# Patient Record
Sex: Female | Born: 1950 | Race: Black or African American | Hispanic: No | Marital: Single | Smoking: Never smoker
Health system: Southern US, Community
[De-identification: ages and names within clinical notes are randomized; demographics above are authoritative.]

## PROBLEM LIST (undated history)

## (undated) DIAGNOSIS — C55 Malignant neoplasm of uterus, part unspecified: Secondary | ICD-10-CM

## (undated) DIAGNOSIS — I2699 Other pulmonary embolism without acute cor pulmonale: Secondary | ICD-10-CM

---

## 2007-03-29 DIAGNOSIS — D509 Iron deficiency anemia, unspecified: Secondary | ICD-10-CM

## 2007-03-29 HISTORY — DX: Iron deficiency anemia, unspecified: D50.9

## 2007-05-16 DIAGNOSIS — Z1211 Encounter for screening for malignant neoplasm of colon: Secondary | ICD-10-CM

## 2007-05-16 HISTORY — DX: Encounter for screening for malignant neoplasm of colon: Z12.11

## 2007-06-14 DIAGNOSIS — D250 Submucous leiomyoma of uterus: Secondary | ICD-10-CM

## 2007-06-14 HISTORY — DX: Submucous leiomyoma of uterus: D25.0

## 2020-01-16 DIAGNOSIS — C562 Malignant neoplasm of left ovary: Secondary | ICD-10-CM

## 2020-01-16 HISTORY — DX: Malignant neoplasm of left ovary: C56.2

## 2020-01-17 DIAGNOSIS — C786 Secondary malignant neoplasm of retroperitoneum and peritoneum: Secondary | ICD-10-CM

## 2020-01-17 HISTORY — DX: Secondary malignant neoplasm of retroperitoneum and peritoneum: C78.6

## 2020-03-05 ENCOUNTER — Encounter (HOSPITAL_BASED_OUTPATIENT_CLINIC_OR_DEPARTMENT_OTHER): Payer: Self-pay | Admitting: Emergency Medicine

## 2020-03-05 DIAGNOSIS — I10 Essential (primary) hypertension: Secondary | ICD-10-CM | POA: Diagnosis not present

## 2020-03-05 DIAGNOSIS — Z8541 Personal history of malignant neoplasm of cervix uteri: Secondary | ICD-10-CM | POA: Diagnosis not present

## 2020-03-05 DIAGNOSIS — Z20822 Contact with and (suspected) exposure to covid-19: Secondary | ICD-10-CM | POA: Insufficient documentation

## 2020-03-05 DIAGNOSIS — R9431 Abnormal electrocardiogram [ECG] [EKG]: Secondary | ICD-10-CM | POA: Diagnosis not present

## 2020-03-05 NOTE — ED Triage Notes (Signed)
Pt states her blood pressure has been increasing steadily   Pt was seen at urgent care and it was 216/116 and was instructed to come to the ER  Pt is from [place] and is here visiting

## 2020-03-06 ENCOUNTER — Encounter (HOSPITAL_BASED_OUTPATIENT_CLINIC_OR_DEPARTMENT_OTHER): Payer: Self-pay | Admitting: Emergency Medicine

## 2020-03-06 ENCOUNTER — Other Ambulatory Visit: Payer: Self-pay

## 2020-03-06 ENCOUNTER — Emergency Department (HOSPITAL_BASED_OUTPATIENT_CLINIC_OR_DEPARTMENT_OTHER)
Admission: EM | Admit: 2020-03-06 | Discharge: 2020-03-06 | Disposition: A | Discharge status: Home / Self Care | Payer: Medicare Other | Attending: Emergency Medicine | Admitting: Emergency Medicine

## 2020-03-06 ENCOUNTER — Emergency Department (HOSPITAL_BASED_OUTPATIENT_CLINIC_OR_DEPARTMENT_OTHER): Payer: Medicare Other

## 2020-03-06 DIAGNOSIS — R9431 Abnormal electrocardiogram [ECG] [EKG]: Secondary | ICD-10-CM

## 2020-03-06 DIAGNOSIS — I10 Essential (primary) hypertension: Secondary | ICD-10-CM | POA: Diagnosis not present

## 2020-03-06 HISTORY — DX: Malignant neoplasm of uterus, part unspecified: C55

## 2020-03-06 HISTORY — DX: Other pulmonary embolism without acute cor pulmonale: I26.99

## 2020-03-06 LAB — URINALYSIS, ROUTINE W REFLEX MICROSCOPIC
Bilirubin Urine: NEGATIVE
Glucose, UA: NEGATIVE mg/dL
Hgb urine dipstick: NEGATIVE
Ketones, ur: NEGATIVE mg/dL
Leukocytes,Ua: NEGATIVE
Nitrite: NEGATIVE
Protein, ur: NEGATIVE mg/dL
Specific Gravity, Urine: 1.005 (ref 1.005–1.030)
pH: 6 (ref 5.0–8.0)

## 2020-03-06 LAB — CBC WITH DIFFERENTIAL/PLATELET
Abs Immature Granulocytes: 0.05 10*3/uL (ref 0.00–0.07)
Basophils Absolute: 0 10*3/uL (ref 0.0–0.1)
Basophils Relative: 0 %
Eosinophils Absolute: 0 10*3/uL (ref 0.0–0.5)
Eosinophils Relative: 0 %
HCT: 35.1 % — ABNORMAL LOW (ref 36.0–46.0)
Hemoglobin: 11.6 g/dL — ABNORMAL LOW (ref 12.0–15.0)
Immature Granulocytes: 1 %
Lymphocytes Relative: 14 %
Lymphs Abs: 0.6 10*3/uL — ABNORMAL LOW (ref 0.7–4.0)
MCH: 27.1 pg (ref 26.0–34.0)
MCHC: 33 g/dL (ref 30.0–36.0)
MCV: 82 fL (ref 80.0–100.0)
Monocytes Absolute: 0.1 10*3/uL (ref 0.1–1.0)
Monocytes Relative: 2 %
Neutro Abs: 3.5 10*3/uL (ref 1.7–7.7)
Neutrophils Relative %: 83 %
Platelets: 136 10*3/uL — ABNORMAL LOW (ref 150–400)
RBC: 4.28 MIL/uL (ref 3.87–5.11)
RDW: 15.6 % — ABNORMAL HIGH (ref 11.5–15.5)
WBC: 4.2 10*3/uL (ref 4.0–10.5)
nRBC: 0 % (ref 0.0–0.2)

## 2020-03-06 LAB — BASIC METABOLIC PANEL
Anion gap: 12 (ref 5–15)
BUN: 11 mg/dL (ref 8–23)
CO2: 23 mmol/L (ref 22–32)
Calcium: 9.8 mg/dL (ref 8.9–10.3)
Chloride: 101 mmol/L (ref 98–111)
Creatinine, Ser: 0.81 mg/dL (ref 0.44–1.00)
GFR, Estimated: >60 mL/min (ref >60)
Glucose, Bld: 195 mg/dL — ABNORMAL HIGH (ref 70–99)
Potassium: 3.5 mmol/L (ref 3.5–5.1)
Sodium: 136 mmol/L (ref 135–145)

## 2020-03-06 LAB — RESP PANEL BY RT-PCR (FLU A&B, COVID) ARPGX2
Influenza A by PCR: NEGATIVE
Influenza B by PCR: NEGATIVE
SARS Coronavirus 2 by RT PCR: NEGATIVE

## 2020-03-06 LAB — TROPONIN I (HIGH SENSITIVITY): Troponin I (High Sensitivity): 7 ng/L (ref <18)

## 2020-03-06 MED ORDER — AMLODIPINE BESYLATE 5 MG PO TABS: 5.0000 mg | ORAL_TABLET | Freq: Every day | ORAL | 0 refills | Status: DC

## 2020-03-06 MED ORDER — AMLODIPINE BESYLATE 5 MG PO TABS
5.0000 mg | ORAL_TABLET | Freq: Once | ORAL | Status: AC
  Administered 2020-03-06: 5 mg via ORAL
  Filled 2020-03-06: qty 1

## 2020-03-06 MED ORDER — HEPARIN SOD (PORK) LOCK FLUSH 100 UNIT/ML IV SOLN
500.0000 [IU] | Freq: Once | INTRAVENOUS | Status: AC
  Administered 2020-03-06: 500 [IU]
  Filled 2020-03-06: qty 5

## 2020-03-06 NOTE — ED Notes (Signed)
Second trop discontinued per EDP

## 2020-03-06 NOTE — ED Provider Notes (Signed)
[facility] Provider Note   CSN: [id number] Arrival date & time: 03/05/20  9371     History Chief Complaint  Patient presents with  . Hypertension    [facility] is a 70 y.o. female.  The history is provided by the patient.  Hypertension This is a chronic problem. The current episode started more than 1 week ago. The problem occurs constantly. The problem has been gradually worsening. Pertinent negatives include no chest pain, no abdominal pain, no headaches and no shortness of breath. Nothing aggravates the symptoms. Nothing relieves the symptoms. She has tried nothing for the symptoms. The treatment provided no relief.  Patient with cancer with HTN since November that no one is treating.  Patient is asymptomatic but sent in for same.      Past Medical History:  Diagnosis Date  . Pulmonary emboli ([place])   . Uterine cancer ([place])     There are no problems to display for this patient.   History reviewed. No pertinent surgical history.   OB History   No obstetric history on file.     Family History  Problem Relation Age of Onset  . Congenital heart disease Mother   . Heart failure Father     Social History   Tobacco Use  . Smoking status: Never Smoker  . Smokeless tobacco: Never Used  Vaping Use  . Vaping Use: Never used  Substance Use Topics  . Alcohol use: Yes    Comment: seldom  . Drug use: Never    Home Medications Prior to Admission medications   Not on File    Allergies    Patient has no known allergies.  Review of Systems   Review of Systems  Constitutional: Negative for fever.  HENT: Negative for congestion.   Eyes: Negative for visual disturbance.  Respiratory: Negative for chest tightness and shortness of breath.   Cardiovascular: Negative for chest pain.  Gastrointestinal: Negative for abdominal pain and vomiting.  Genitourinary: Negative for difficulty urinating.  Musculoskeletal: Negative for back  pain and neck pain.  Skin: Negative for wound.  Neurological: Negative for dizziness, seizures, facial asymmetry, speech difficulty, weakness, numbness and headaches.  Psychiatric/Behavioral: Negative for agitation.  All other systems reviewed and are negative.   Physical Exam Updated Vital Signs BP (!) 179/82   Pulse 62   Temp 97.6 F (36.4 C) (Oral)   Resp 16   Ht 5\' 5"  (1.651 m)   Wt 88 kg   SpO2 95%   BMI 32.28 kg/m   Physical Exam Vitals and nursing note reviewed.  Constitutional:      General: She is not in acute distress.    Appearance: Normal appearance.  HENT:     Head: Normocephalic and atraumatic.     Nose: Nose normal.  Eyes:     Extraocular Movements: Extraocular movements intact.     Conjunctiva/sclera: Conjunctivae normal.     Pupils: Pupils are equal, round, and reactive to light.  Cardiovascular:     Rate and Rhythm: Normal rate and regular rhythm.     Pulses: Normal pulses.     Heart sounds: Normal heart sounds.  Pulmonary:     Effort: Pulmonary effort is normal.     Breath sounds: Normal breath sounds.  Abdominal:     General: Abdomen is flat. Bowel sounds are normal.     Palpations: Abdomen is soft.     Tenderness: There is no abdominal tenderness. There is no guarding.  Musculoskeletal:  General: Normal range of motion.     Cervical back: Normal range of motion and neck supple.  Skin:    General: Skin is warm and dry.     Capillary Refill: Capillary refill takes less than 2 seconds.  Neurological:     General: No focal deficit present.     Mental Status: She is alert and oriented to person, place, and time.     Deep Tendon Reflexes: Reflexes normal.  Psychiatric:        Mood and Affect: Mood normal.        Behavior: Behavior normal.     ED Results / Procedures / Treatments   Labs (all labs ordered are listed, but only abnormal results are displayed) Results for orders placed or performed during the hospital encounter of 03/06/20   Resp Panel by RT-PCR (Flu A&B, Covid) Nasopharyngeal Swab   Specimen: Nasopharyngeal Swab; Nasopharyngeal(NP) swabs in vial transport medium  Result Value Ref Range   SARS Coronavirus 2 by RT PCR NEGATIVE NEGATIVE   Influenza A by PCR NEGATIVE NEGATIVE   Influenza B by PCR NEGATIVE NEGATIVE  CBC with Differential/Platelet  Result Value Ref Range   WBC 4.2 4.0 - 10.5 K/uL   RBC 4.28 3.87 - 5.11 MIL/uL   Hemoglobin 11.6 (L) 12.0 - 15.0 g/dL   HCT 35.1 (L) 36.0 - 46.0 %   MCV 82.0 80.0 - 100.0 fL   MCH 27.1 26.0 - 34.0 pg   MCHC 33.0 30.0 - 36.0 g/dL   RDW 15.6 (H) 11.5 - 15.5 %   Platelets 136 (L) 150 - 400 K/uL   nRBC 0.0 0.0 - 0.2 %   Neutrophils Relative % 83 %   Neutro Abs 3.5 1.7 - 7.7 K/uL   Lymphocytes Relative 14 %   Lymphs Abs 0.6 (L) 0.7 - 4.0 K/uL   Monocytes Relative 2 %   Monocytes Absolute 0.1 0.1 - 1.0 K/uL   Eosinophils Relative 0 %   Eosinophils Absolute 0.0 0.0 - 0.5 K/uL   Basophils Relative 0 %   Basophils Absolute 0.0 0.0 - 0.1 K/uL   Immature Granulocytes 1 %   Abs Immature Granulocytes 0.05 0.00 - 0.07 K/uL  Basic metabolic panel  Result Value Ref Range   Sodium 136 135 - 145 mmol/L   Potassium 3.5 3.5 - 5.1 mmol/L   Chloride 101 98 - 111 mmol/L   CO2 23 22 - 32 mmol/L   Glucose, Bld 195 (H) 70 - 99 mg/dL   BUN 11 8 - 23 mg/dL   Creatinine, Ser 0.81 0.44 - 1.00 mg/dL   Calcium 9.8 8.9 - 10.3 mg/dL   GFR, Estimated >60 >60 mL/min   Anion gap 12 5 - 15  Troponin I (High Sensitivity)  Result Value Ref Range   Troponin I (High Sensitivity) 7 <18 ng/L   DG Chest Portable 1 View  Result Date: 03/06/2020 CLINICAL DATA:  Pain.  Elevated blood pressure. EXAM: PORTABLE CHEST 1 VIEW COMPARISON:  None. FINDINGS: There is a well-positioned right-sided Port-A-Cath. There is no pneumothorax or large pleural effusion. The heart size is unremarkable. Aortic calcifications are noted. There is an old healed left-sided rib fracture. IMPRESSION: 1. No acute  cardiopulmonary process. 2. Well-positioned right-sided Port-A-Cath. Electronically Signed   By: Constance Holster M.D.   On: 03/06/2020 02:08    EKG  EKG Interpretation  Date/Time:  Wednesday March 06 2020 01:46:13 EST Ventricular Rate:  72 PR Interval:    QRS Duration: 111 QT  Interval:  613 QTC Calculation: 672 R Axis:   -32 Text Interpretation: Sinus rhythm Left axis deviation Prolonged QT interval Confirmed by Dory Horn) on 03/06/2020 4:13:43 AM       Radiology DG Chest Portable 1 View  Result Date: 03/06/2020 CLINICAL DATA:  Pain.  Elevated blood pressure. EXAM: PORTABLE CHEST 1 VIEW COMPARISON:  None. FINDINGS: There is a well-positioned right-sided Port-A-Cath. There is no pneumothorax or large pleural effusion. The heart size is unremarkable. Aortic calcifications are noted. There is an old healed left-sided rib fracture. IMPRESSION: 1. No acute cardiopulmonary process. 2. Well-positioned right-sided Port-A-Cath. Electronically Signed   By: Constance Holster M.D.   On: 03/06/2020 02:08    Procedures Procedures (including critical care time)  Medications Ordered in ED Medications  amLODipine (NORVASC) tablet 5 mg (5 mg Oral Given 03/06/20 0242)    ED Course  I have reviewed the triage vital signs and the nursing notes.  Pertinent labs & imaging results that were available during my care of the patient were reviewed by me and considered in my medical decision making (see chart for details).   BP already improved without intervential.  Low dose norvasc started in the ED. aptient will need to follow up with cardiology regarding BP and prolonged QT interval on EKG.  No QT prolonging medications.    [facility] was evaluated in Emergency Department on 03/06/2020 for the symptoms described in the history of present illness. She was evaluated in the context of the global COVID-19 pandemic, which necessitated consideration that the patient might be at risk for  infection with the SARS-CoV-2 virus that causes COVID-19. Institutional protocols and algorithms that pertain to the evaluation of patients at risk for COVID-19 are in a state of rapid change based on information released by regulatory bodies including the CDC and federal and state organizations. These policies and algorithms were followed during the patient's care in the ED.  Final Clinical Impression(s) / ED Diagnoses Final diagnoses:  Primary hypertension  Prolonged Q-T interval on ECG   Return for intractable cough, coughing up blood,fevers >100.4 unrelieved by medication, shortness of breath, intractable vomiting, chest pain, shortness of breath, weakness,numbness, changes in speech, facial asymmetry,abdominal pain, passing out,Inability to tolerate liquids or food, cough, altered mental status or any concerns. No signs of systemic illness or infection. The patient is nontoxic-appearing on exam and vital signs are within normal limits.   I have reviewed the triage vital signs and the nursing notes. Pertinent labs &imaging results that were available during my care of the patient were reviewed by me and considered in my medical decision making (see chart for details).After history, exam, and medical workup I feel the patient has beenappropriately medically screened and is safe for discharge home. Pertinent diagnoses were discussed with the patient. Patient was given return precautions.      Janaisha Tolsma, MD 03/06/20 [phone]

## 2020-03-12 ENCOUNTER — Other Ambulatory Visit: Payer: Self-pay

## 2020-03-12 DIAGNOSIS — C55 Malignant neoplasm of uterus, part unspecified: Secondary | ICD-10-CM | POA: Insufficient documentation

## 2020-03-12 DIAGNOSIS — C541 Malignant neoplasm of endometrium: Secondary | ICD-10-CM

## 2020-03-12 DIAGNOSIS — I2699 Other pulmonary embolism without acute cor pulmonale: Secondary | ICD-10-CM | POA: Insufficient documentation

## 2020-03-12 DIAGNOSIS — I10 Essential (primary) hypertension: Secondary | ICD-10-CM | POA: Insufficient documentation

## 2020-03-12 HISTORY — DX: Malignant neoplasm of endometrium: C54.1

## 2020-03-14 ENCOUNTER — Other Ambulatory Visit: Payer: Self-pay

## 2020-03-14 ENCOUNTER — Encounter: Payer: Self-pay | Admitting: Cardiology

## 2020-03-14 ENCOUNTER — Ambulatory Visit (INDEPENDENT_AMBULATORY_CARE_PROVIDER_SITE_OTHER): Payer: Medicare Other | Admitting: Cardiology

## 2020-03-14 VITALS — BP 164/100 | HR 100 | Ht 65.0 in | Wt 189.0 lb

## 2020-03-14 DIAGNOSIS — R600 Localized edema: Secondary | ICD-10-CM | POA: Diagnosis not present

## 2020-03-14 DIAGNOSIS — R9431 Abnormal electrocardiogram [ECG] [EKG]: Secondary | ICD-10-CM

## 2020-03-14 DIAGNOSIS — I10 Essential (primary) hypertension: Secondary | ICD-10-CM | POA: Diagnosis not present

## 2020-03-14 MED ORDER — METOPROLOL TARTRATE 100 MG PO TABS: 100.0000 mg | ORAL_TABLET | Freq: Once | ORAL | 0 refills | Status: DC

## 2020-03-14 MED ORDER — HYDROCHLOROTHIAZIDE 25 MG PO TABS: 25.0000 mg | ORAL_TABLET | Freq: Every day | ORAL | 1 refills | Status: AC

## 2020-03-14 NOTE — Patient Instructions (Signed)
Medication Instructions:  Your physician has recommended you make the following change in your medication:   START: Hydrochlorothiazide 25 mg daily   *If you need a refill on your cardiac medications before your next appointment, please call your pharmacy*   Lab Work: Your physician recommends that you return for lab work today: bmp, mg   Your physician recommends that you return for lab work 3-7 days before ct: BMP If you have labs (blood work) drawn today and your tests are completely normal, you will receive your results only by: Marland Kitchen MyChart Message (if you have MyChart) OR . A paper copy in the mail If you have any lab test that is abnormal or we need to change your treatment, we will call you to review the results.   Testing/Procedures: Your cardiac CT will be scheduled at one of the below locations:   [facility] [address] [place], [place] [address] [phone]  [facility] [address] [place], [place] [address] [phone]  If scheduled at [facility], please arrive at the [facility] main entrance of [facility] 30 minutes prior to test start time. Proceed to the [facility] Radiology Department (first floor) to check-in and test prep.  If scheduled at [facility], please arrive 15 mins early for check-in and test prep.  Please follow these instructions carefully (unless otherwise directed):   On the Night Before the Test: . Be sure to Drink plenty of water. . Do not consume any caffeinated/decaffeinated beverages or chocolate 12 hours prior to your test. . Do not take any antihistamines 12 hours prior to your test.   On the Day of the Test: . Drink plenty of water. Do not drink any water within one hour of the test. . Do not eat any food 4 hours prior to the test. . You may take your regular medications prior to the test.  . Take metoprolol  (Lopressor) two hours prior to test. . HOLD Hydrochlorothiazide morning of the test. . FEMALES- please wear underwire-free bra if available       After the Test: . Drink plenty of water. . After receiving IV contrast, you may experience a mild flushed feeling. This is normal. . On occasion, you may experience a mild rash up to 24 hours after the test. This is not dangerous. If this occurs, you can take Benadryl 25 mg and increase your fluid intake. . If you experience trouble breathing, this can be serious. If it is severe call 911 IMMEDIATELY. If it is mild, please call our office. . If you take any of these medications: Glipizide/Metformin, Avandament, Glucavance, please do not take 48 hours after completing test unless otherwise instructed.   Once we have confirmed authorization from your insurance company, we will call you to set up a date and time for your test. Based on how quickly your insurance processes prior authorizations requests, please allow up to 4 weeks to be contacted for scheduling your Cardiac CT appointment. Be advised that routine Cardiac CT appointments could be scheduled as many as 8 weeks after your provider has ordered it.  For non-scheduling related questions, please contact the cardiac imaging nurse navigator should you have any questions/concerns: Marchia Bond, Cardiac Imaging Nurse Navigator Burley Saver, Interim Cardiac Imaging Nurse [place] [facility] Direct Office Dial: [phone]   For scheduling needs, including cancellations and rescheduling, please call [place], [phone].  Your physician has requested that you have an echocardiogram. Echocardiography is a painless test that uses sound waves to create images of your heart. It provides your doctor with information about the size and shape of your heart and how well your heart's chambers and valves are working. This procedure takes approximately one hour. There are no  restrictions for this procedure.    Follow-Up: At [facility], you and your health needs are our priority.  As part of our continuing mission to provide you with exceptional heart care, we have created designated Provider Care Teams.  These Care Teams include your primary Cardiologist (physician) and Advanced Practice Providers (APPs -  Physician Assistants and Nurse Practitioners) who all work together to provide you with the care you need, when you need it.  We recommend signing up for the patient portal called "MyChart".  Sign up information is provided on this After Visit Summary.  MyChart is used to connect with patients for Virtual Visits (Telemedicine).  Patients are able to view lab/test results, encounter notes, upcoming appointments, etc.  Non-urgent messages can be sent to your provider as well.   To learn more about what you can do with MyChart, go to [email or web address].    Your next appointment:   1 month(s)  The format for your next appointment:   In Person  Provider:   Berniece Salines, DO   Other Instructions   Echocardiogram An echocardiogram is a test that uses sound waves (ultrasound) to produce images of the heart. Images from an echocardiogram can provide important information about:  Heart size and shape.  The size and thickness and movement of your heart's walls.  Heart muscle function and strength.  Heart valve function or if you have stenosis. Stenosis is when the heart valves are too narrow.  If blood is flowing backward through the heart valves (regurgitation).  A tumor or infectious growth around the heart valves.  Areas of heart muscle that are not working well because of poor blood flow or injury from a heart attack.  Aneurysm detection. An aneurysm is a weak or damaged part of an artery wall. The wall bulges out from the normal force of blood pumping through the body. Tell a health care provider about:  Any allergies you have.  All  medicines you are taking, including vitamins, herbs, eye drops, creams, and over-the-counter medicines.  Any blood disorders you have.  Any surgeries you have had.  Any medical conditions you have.  Whether you are pregnant or may be pregnant. What are the risks? Generally, this is a safe test. However, problems may occur, including an allergic reaction to dye (contrast) that may be used during the test. What happens before the test? No specific preparation is needed. You may eat and drink normally. What happens during the test?  You will take off your clothes from the waist up and put on a hospital gown.  Electrodes or electrocardiogram (ECG)patches may be placed on your chest. The electrodes or patches are then connected to a device that monitors your heart rate and rhythm.  You will lie down on a table for an ultrasound exam. A gel will be applied to your chest to help sound waves pass through your skin.  A handheld device, called a transducer, will be pressed against your chest and moved over your heart. The transducer produces sound waves that travel to your heart and bounce back (or "echo" back) to the transducer. These sound waves will be captured in real-time  and changed into images of your heart that can be viewed on a video monitor. The images will be recorded on a computer and reviewed by your health care provider.  You may be asked to change positions or hold your breath for a short time. This makes it easier to get different views or better views of your heart.  In some cases, you may receive contrast through an IV in one of your veins. This can improve the quality of the pictures from your heart. The procedure may vary among health care providers and hospitals.   What can I expect after the test? You may return to your normal, everyday life, including diet, activities, and medicines, unless your health care provider tells you not to do that. Follow these instructions at  home:  It is up to you to get the results of your test. Ask your health care provider, or the department that is doing the test, when your results will be ready.  Keep all follow-up visits. This is important. Summary  An echocardiogram is a test that uses sound waves (ultrasound) to produce images of the heart.  Images from an echocardiogram can provide important information about the size and shape of your heart, heart muscle function, heart valve function, and other possible heart problems.  You do not need to do anything to prepare before this test. You may eat and drink normally.  After the echocardiogram is completed, you may return to your normal, everyday life, unless your health care provider tells you not to do that. This information is not intended to replace advice given to you by your health care provider. Make sure you discuss any questions you have with your health care provider. Document Revised: 10/03/2019 Document Reviewed: 10/03/2019 Elsevier Patient Education  2021 [place].   Cardiac CT Angiogram A cardiac CT angiogram is a procedure to look at the heart and the area around the heart. It may be done to help find the cause of chest pains or other symptoms of heart disease. During this procedure, a substance called contrast dye is injected into the blood vessels in the area to be checked. A large X-ray machine, called a CT scanner, then takes detailed pictures of the heart and the surrounding area. The procedure is also sometimes called a coronary CT angiogram, coronary artery scanning, or CTA. A cardiac CT angiogram allows the health care provider to see how well blood is flowing to and from the heart. The health care provider will be able to see if there are any problems, such as:  Blockage or narrowing of the coronary arteries in the heart.  Fluid around the heart.  Signs of weakness or disease in the muscles, valves, and tissues of the heart. Tell a health care  provider about:  Any allergies you have. This is especially important if you have had a previous allergic reaction to contrast dye.  All medicines you are taking, including vitamins, herbs, eye drops, creams, and over-the-counter medicines.  Any blood disorders you have.  Any surgeries you have had.  Any medical conditions you have.  Whether you are pregnant or may be pregnant.  Any anxiety disorders, chronic pain, or other conditions you have that may increase your stress or prevent you from lying still. What are the risks? Generally, this is a safe procedure. However, problems may occur, including:  Bleeding.  Infection.  Allergic reactions to medicines or dyes.  Damage to other structures or organs.  Kidney damage from the  contrast dye that is used.  Increased risk of cancer from radiation exposure. This risk is low. Talk with your health care provider about: ? The risks and benefits of testing. ? How you can receive the lowest dose of radiation. What happens before the procedure?  Wear comfortable clothing and remove any jewelry, glasses, dentures, and hearing aids.  Follow instructions from your health care provider about eating and drinking. This may include: ? For 12 hours before the procedure -- avoid caffeine. This includes tea, coffee, soda, energy drinks, and diet pills. Drink plenty of water or other fluids that do not have caffeine in them. Being well hydrated can prevent complications. ? For 4-6 hours before the procedure -- stop eating and drinking. The contrast dye can cause nausea, but this is less likely if your stomach is empty.  Ask your health care provider about changing or stopping your regular medicines. This is especially important if you are taking diabetes medicines, blood thinners, or medicines to treat problems with erections (erectile dysfunction). What happens during the procedure?  Hair on your chest may need to be removed so that small sticky  patches called electrodes can be placed on your chest. These will transmit information that helps to monitor your heart during the procedure.  An IV will be inserted into one of your veins.  You might be given a medicine to control your heart rate during the procedure. This will help to ensure that good images are obtained.  You will be asked to lie on an exam table. This table will slide in and out of the CT machine during the procedure.  Contrast dye will be injected into the IV. You might feel warm, or you may get a metallic taste in your mouth.  You will be given a medicine called nitroglycerin. This will relax or dilate the arteries in your heart.  The table that you are lying on will move into the CT machine tunnel for the scan.  The person running the machine will give you instructions while the scans are being done. You may be asked to: ? Keep your arms above your head. ? Hold your breath. ? Stay very still, even if the table is moving.  When the scanning is complete, you will be moved out of the machine.  The IV will be removed. The procedure may vary among health care providers and hospitals.   What can I expect after the procedure? After your procedure, it is common to have:  A metallic taste in your mouth from the contrast dye.  A feeling of warmth.  A headache from the nitroglycerin. Follow these instructions at home:  Take over-the-counter and prescription medicines only as told by your health care provider.  If you are told, drink enough fluid to keep your urine pale yellow. This will help to flush the contrast dye out of your body.  Most people can return to their normal activities right after the procedure. Ask your health care provider what activities are safe for you.  It is up to you to get the results of your procedure. Ask your health care provider, or the department that is doing the procedure, when your results will be ready.  Keep all follow-up visits  as told by your health care provider. This is important. Contact a health care provider if:  You have any symptoms of allergy to the contrast dye. These include: ? Shortness of breath. ? Rash or hives. ? A racing heartbeat. Summary  A cardiac CT angiogram is a procedure to look at the heart and the area around the heart. It may be done to help find the cause of chest pains or other symptoms of heart disease.  During this procedure, a large X-ray machine, called a CT scanner, takes detailed pictures of the heart and the surrounding area after a contrast dye has been injected into blood vessels in the area.  Ask your health care provider about changing or stopping your regular medicines before the procedure. This is especially important if you are taking diabetes medicines, blood thinners, or medicines to treat erectile dysfunction.  If you are told, drink enough fluid to keep your urine pale yellow. This will help to flush the contrast dye out of your body. This information is not intended to replace advice given to you by your health care provider. Make sure you discuss any questions you have with your health care provider. Document Revised: 10/05/2018 Document Reviewed: 10/05/2018 Elsevier Patient Education  2021 [place].  Hydrochlorothiazide Capsules or Tablets What is this medicine? HYDROCHLOROTHIAZIDE (hye droe klor oh THYE a zide) is a diuretic. It helps you make more urine and to lose salt and excess water from your body. It treats swelling from heart, kidney, or liver disease. It also treats high blood pressure. This medicine may be used for other purposes; ask your health care provider or pharmacist if you have questions. COMMON BRAND NAME(S): Esidrix, Ezide, HydroDIURIL, Microzide, Oretic, Zide What should I tell my health care provider before I take this medicine? They need to know if you have any of these conditions:  diabetes  gout  kidney disease  liver  disease  lupus  pancreatitis  an unusual or allergic reaction to hydrochlorothiazide, sulfa drugs, other medicines, foods, dyes, or preservatives  pregnant or trying to get pregnant  breast-feeding How should I use this medicine? Take this medicine by mouth. Take it as directed on the prescription label at the same time every day. You can take it with or without food. If it upsets your stomach, take it with food. Keep taking it unless your health care provider tells you to stop. Talk to your health care provider about the use of this medicine in children. While it may be prescribed for children as young as newborns for selected conditions, precautions do apply. Overdosage: If you think you have taken too much of this medicine contact a poison control center or emergency room at once. NOTE: This medicine is only for you. Do not share this medicine with others. What if I miss a dose? If you miss a dose, take it as soon as you can. If it is almost time for your next dose, take only that dose. Do not take double or extra doses. What may interact with this medicine?  cholestyramine  colestipol  digoxin  dofetilide  lithium  medicines for blood pressure  medicines for diabetes  medicines that relax muscles for surgery  other diuretics  steroid medicines like prednisone or cortisone This list may not describe all possible interactions. Give your health care provider a list of all the medicines, herbs, non-prescription drugs, or dietary supplements you use. Also tell them if you smoke, drink alcohol, or use illegal drugs. Some items may interact with your medicine. What should I watch for while using this medicine? Visit your health care provider for regular check ups. Check your blood pressure as directed. Ask your health care provider what your blood pressure should be. Also, find  out when you should contact him or her. Do not treat yourself for coughs, colds, or pain while you  are using this medicine without asking your health care provider for advice. Some medicines may increase your blood pressure. You may get drowsy or dizzy. Do not drive, use machinery, or do anything that needs mental alertness until you know how this medicine affects you. Do not stand or sit up quickly, especially if you are an older patient. This reduces the risk of dizzy or fainting spells. Alcohol can make you more drowsy and dizzy. Avoid alcoholic drinks. Talk to your health care professional about your risk of skin cancer. You may be more at risk for skin cancer if you take this medicine. This medicine can make you more sensitive to the sun. Keep out of the sun. If you cannot avoid being in the sun, wear protective clothing and use sunscreen. Do not use sun lamps or tanning beds/booths. You may need to be on a special diet while taking this medicine. Ask your health care provider. Also, find out how many glasses of fluids you need to drink each day. Check with your health care provider if you get an attack of severe diarrhea, nausea and vomiting, or if you sweat a lot. The loss of too much body fluid can make it dangerous for you to take this medicine. This medicine may increase blood sugar. Ask your healthcare provider if changes in diet or medicines are needed if you have diabetes. What side effects may I notice from receiving this medicine? Side effects that you should report to your doctor or health care professional as soon as possible:  allergic reactions (skin rash, itching or hives; swelling of the face, lips, or tongue)  gout (severe pain, redness, or swelling in joints like the big toe)  high blood sugar (increased hunger, thirst or urination; unusually weak or tired; blurry vision)  kidney injury (trouble passing urine or change in the amount of urine)  low blood pressure (dizziness; feeling faint or lightheaded, falls; unusually weak or tired)  low potassium levels (trouble  breathing; chest pain; dizziness; fast, irregular heartbeat; feeling faint or lightheaded, falls; muscle cramps or pain)  sudden change in vision or eye pain Side effects that usually do not require medical attention (report to your doctor or health care professional if they continue or are bothersome):  change in sex drive or performance  dry mouth  headache  stomach upset This list may not describe all possible side effects. Call your doctor for medical advice about side effects. You may report side effects to FDA at 1-800-FDA-1088. Where should I keep my medicine? Keep out of the reach of children and pets. Store at room temperature between 20 and 25 degrees C (68 and 77 degrees F). Protect from light and moisture. Keep the container tightly closed. Do not freeze. Get rid of any unused medicine after the expiration date. To get rid of medicines that are no longer needed or have expired:  Take the medicine to a medicine take-back program. Check with your pharmacy or law enforcement to find a location.  If you cannot return the medicine, check the label or package insert to see if the medicine should be thrown out in the garbage or flushed down the toilet. If you are not sure, ask your health care provider. If it is safe to put in the trash, empty the medicine out of the container. Mix the medicine with cat litter, dirt, coffee grounds, or  other unwanted substance. Seal the mixture in a bag or container. Put it in the trash. NOTE: This sheet is a summary. It may not cover all possible information. If you have questions about this medicine, talk to your doctor, pharmacist, or health care provider.  2021 Elsevier/Gold Standard (2019-12-20 17:16:00)

## 2020-03-14 NOTE — Progress Notes (Signed)
Cardiology Office Note:    Date:  03/14/2020   ID:  Crystal Wolf, DOB Jun 04, 1950, MRN [id number]  PCP:  Patient, No Pcp Per  Cardiologist:  Crystal Salines, DO  Electrophysiologist:  None   Referring MD: No ref. provider found   " I was recently diagnosed with hypertension"  History of Present Illness:    Crystal Wolf is a 70 y.o. female with a hx pulmonary embolism on Eliquis, of high-grade ovarian carcinoma holes been receiving chemotherapy with [facility] she has completed 3 cycles of Taxol/carboplatin.  After her third cycle of chemotherapy the patient became significantly hypertensive she was recommended to be seen in emergency department unfortunately she tells me the emergency department was that and she had to end up going to 2 different urgent care and finally was seen at the last urgent care at which time she was started on amlodipine 5 mg daily.  Prior to this she tells me she has not had any hypertension issues.  He has not pretty much had any cardiovascular problems that she was aware of.   Of note the patient was diagnosed with high-grade ovarian carcinoma at [facility] and she follow-up with Dr. Sabra Wolf who she plans to go back to in March for upcoming surgery.  She had come to [facility] because her daughter lives here and it was easier for her to get her chemotherapy through [facility],.  She denies any chest pain, shortness of breath lightheadedness or dizziness.  She has been taking her amlodipine 5 mg daily.  She recently bought a blood pressure cuff.  She tells me she normally does not eat increased salt.  Past Medical History:  Diagnosis Date  . Iron deficiency anemia 03/29/2007  . 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 Pulmonary emboli ([place])   . Screening for colon cancer 05/16/2007   Formatting  of this note might be different from the original. colonoscopy 8/09 normal.  Next colorectal cancer screening due Aug 2019  . Submucous uterine fibroid 06/14/2007  . Uterine cancer ([place])     History reviewed. No pertinent surgical history.  Current Medications: Current Meds  Medication Sig  . amLODipine (NORVASC) 5 MG tablet Take 1 tablet (5 mg total) by mouth daily.  Marland Kitchen ELIQUIS 5 MG TABS tablet Take 5 mg by mouth 2 (two) times daily.  . hydrochlorothiazide (HYDRODIURIL) 25 MG tablet Take 1 tablet (25 mg total) by mouth daily.  . metoprolol tartrate (LOPRESSOR) 100 MG tablet Take 1 tablet (100 mg total) by mouth once for 1 dose. 2 hour before ct  . [DISCONTINUED] dexamethasone (DECADRON) 4 MG tablet Take by mouth.     Allergies:   Codeine   Social History   Socioeconomic History  . Marital status: Single    Spouse name: Not on file  . Number of children: Not on file  . Years of education: Not on file  . Highest education level: Not on file  Occupational History  . Not on file  Tobacco Use  . Smoking status: Never Smoker  . Smokeless tobacco: Never Used  Vaping Use  . Vaping Use: Never used  Substance and Sexual Activity  . Alcohol use: Yes    Comment: seldom  . Drug use: Never  . Sexual activity: Not on file  Other Topics Concern  . Not on file  Social History Narrative  . Not on file   Social Determinants of Health   Financial Resource Strain: Not on file  Food Insecurity: Not on file  Transportation Needs: Not on file  Physical Activity: Not on file  Stress: Not on file  Social Connections: Not on file     Family History: The patient's family history includes Congenital heart disease in her mother; Heart failure in her father.  ROS:   Review of Systems  Constitution: Negative for decreased appetite, fever and weight gain.  HENT: Negative for congestion, ear discharge, hoarse voice and sore throat.   Eyes: Negative for discharge, redness, vision loss in  right eye and visual halos.  Cardiovascular: Negative for chest pain, dyspnea on exertion, leg swelling, orthopnea and palpitations.  Respiratory: Negative for cough, hemoptysis, shortness of breath and snoring.   Endocrine: Negative for heat intolerance and polyphagia.  Hematologic/Lymphatic: Negative for bleeding problem. Does not bruise/bleed easily.  Skin: Negative for flushing, nail changes, rash and suspicious lesions.  Musculoskeletal: Negative for arthritis, joint pain, muscle cramps, myalgias, neck pain and stiffness.  Gastrointestinal: Negative for abdominal pain, bowel incontinence, diarrhea and excessive appetite.  Genitourinary: Negative for decreased libido, genital sores and incomplete emptying.  Neurological: Negative for brief paralysis, focal weakness, headaches and loss of balance.  Psychiatric/Behavioral: Negative for altered mental status, depression and suicidal ideas.  Allergic/Immunologic: Negative for HIV exposure and persistent infections.    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  There is also LVH by aVL criteria.  Nonspecific interventricular conduction defect.  And compared to her EKG which was done on March 07, 2019 at which time she did have elevated QTC 672 but today QTC is normal  Recent Labs: 03/06/2020: BUN 11; Creatinine, Ser 0.81; Hemoglobin 11.6; Platelets 136; Potassium 3.5; Sodium 136  Recent Lipid Panel No results found for: CHOL, TRIG, HDL, CHOLHDL, VLDL, LDLCALC, LDLDIRECT  Physical Exam:    VS:  BP (!) 164/100 (BP Location: Right Arm)   Pulse 100   Ht _0  (1.651 m)   Wt 189 lb (85.7 kg)   SpO2 97%   BMI 31.45 kg/m     Wt Readings from Last 3 Encounters:  03/14/20 189 lb (85.7 kg)  03/05/20 194 lb (88 kg)     GEN: Well nourished, well  developed in no acute distress HEENT: Normal NECK: + JVD; No carotid bruits LYMPHATICS: No lymphadenopathy CARDIAC: S1S2 noted,RRR, no murmurs, rubs, gallops RESPIRATORY:  Clear to auscultation without rales, wheezing or rhonchi  ABDOMEN: Soft, non-tender, non-distended, +bowel sounds, no guarding. EXTREMITIES: Bilateral +1 extremity  edema, No cyanosis, no clubbing MUSCULOSKELETAL:  No deformity  SKIN: Warm and dry NEUROLOGIC:  Alert and oriented x 3, non-focal PSYCHIATRIC:  Normal affect, good insight  ASSESSMENT:    1. Hypertension, unspecified type   2. Abnormal electrocardiogram   3. Bilateral leg edema   4. Prolonged QT interval    PLAN:     1.  She is hypertensive in the office today manually done by me.  What I like to do is keep her on amlodipine and add hydrochlorothiazide 5 mg daily.  Educated the patient what it means to have hypertension also that her goal is less than 130/80 mmHg.  She will continue to take her blood pressure daily she will call my office  with this information in 2 weeks at which time if adjustments need to be done this will be done as well and I will plan to see the patient in 4 weeks.  2. 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  Shared decision coronary CTA would be appropriate.  And also especially the fact that the patient is planning surgery to make sure that coronary artery disease will be limiting at that time.  3. she has bilateral leg edema as well as mild JVD she also has had chemotherapy I like to assess her LVEF.  To make sure cardiomyopathy is not playing a role here as well with her physical exam findings.  4. Thankfully her QT prolongation has resolved.  5. the patient understands the need to lose weight with diet and exercise. We have discussed specific strategies for this.  6.  lab work will be done today to assess kidney function and electrolytes.  The patient is in agreement with the above plan. The patient left the office in stable condition.  The patient will follow up in 1 month.   Medication Adjustments/Labs and Tests Ordered: Current medicines are reviewed at length with the patient today.  Concerns regarding medicines are outlined above.  Orders Placed This Encounter  Procedures  . CT CORONARY MORPH W/CTA COR W/SCORE W/CA W/CM &/OR WO/CM  . Basic metabolic panel  . Magnesium  . Basic metabolic panel  . EKG 12-Lead  . ECHOCARDIOGRAM COMPLETE   Meds ordered this encounter  Medications  . hydrochlorothiazide (HYDRODIURIL) 25 MG tablet    Sig: Take 1 tablet (25 mg total) by mouth daily.    Dispense:  90 tablet    Refill:  1  . metoprolol tartrate (LOPRESSOR) 100 MG tablet    Sig: Take 1 tablet (100 mg total) by mouth once for 1 dose. 2 hour before ct    Dispense:  1 tablet    Refill:  0    Patient Instructions   Medication Instructions:  Your physician has recommended you make the following change in your medication:   START: Hydrochlorothiazide 25 mg daily   *If you need a refill on your cardiac medications before your next appointment, please call your pharmacy*   Lab Work: Your physician recommends that you return for lab work today: bmp, mg   Your physician recommends that you return for lab work 3-7 days before ct: BMP If you have labs (blood work) drawn today and your tests are completely normal, you will receive your results only by: Marland Kitchen MyChart Message (if you have MyChart) OR . A paper copy in the mail If you have any lab test that is abnormal or we need to change your treatment, we will call you to review the results.   Testing/Procedures: Your cardiac CT will be scheduled at one of the below locations:   [facility] [address] [place], [place] [address] [phone]  [place] [address] [place], [place] [address] [phone]  If scheduled at [facility], please arrive at the [facility] main entrance of [facility] 30 minutes prior to test start time. Proceed to the [facility] Radiology Department (first floor) to check-in and test prep.  If scheduled at [facility], please arrive 15 mins early for check-in  and test prep.  Please follow these instructions carefully (unless otherwise directed):   On the Night Before the Test: . Be sure to Drink plenty of water. . Do not consume any caffeinated/decaffeinated beverages or chocolate 12 hours prior to your test. . Do not take any antihistamines 12 hours prior to your test.   On the Day of the Test: . Drink plenty of water. Do not drink any water within one hour of the test. . Do not eat any food 4 hours prior to the test. . You may take your regular medications prior to the test.  . Take metoprolol (Lopressor) two hours prior to test. . HOLD Hydrochlorothiazide morning of the test. . FEMALES- please wear underwire-free bra if available       After the Test: . Drink plenty of water. . After receiving IV contrast, you may experience a mild flushed feeling. This is normal. . On occasion, you may experience a mild rash up to 24 hours after the test. This is not dangerous. If this occurs, you can take Benadryl 25 mg and increase your fluid intake. . If you experience trouble breathing, this can be serious. If it is severe call 911 IMMEDIATELY. If it is mild, please call our office. . If you take any of these medications: Glipizide/Metformin, Avandament, Glucavance, please do not take 48 hours after completing test unless otherwise instructed.   Once we have confirmed authorization from your insurance company, we will call you to set up a date and time for your test. Based on how quickly your insurance processes prior authorizations  requests, please allow up to 4 weeks to be contacted for scheduling your Cardiac CT appointment. Be advised that routine Cardiac CT appointments could be scheduled as many as 8 weeks after your provider has ordered it.  For non-scheduling related questions, please contact the cardiac imaging nurse navigator should you have any questions/concerns: Marchia Bond, Cardiac Imaging Nurse Navigator Burley Saver, Interim Cardiac Imaging Nurse [place] [facility] Direct Office Dial: [phone]   For scheduling needs, including cancellations and rescheduling, please call [place], [phone].   Your physician has requested that you have an echocardiogram. Echocardiography is a painless test that uses sound waves to create images of your heart. It provides your doctor with information about the size and shape of your heart and how well your heart's chambers and valves are working. This procedure takes approximately one hour. There are no restrictions for this procedure.    Follow-Up: At [facility], you and your health needs are our priority.  As part of our continuing mission to provide you with exceptional heart care, we have created designated Provider Care Teams.  These Care Teams include your primary Cardiologist (physician) and Advanced Practice Providers (APPs -  Physician Assistants and Nurse Practitioners) who all work together to provide you with the care you need, when you need it.  We recommend signing up for the patient portal called "MyChart".  Sign up information is provided on this After Visit Summary.  MyChart is used to connect with patients for Virtual Visits (Telemedicine).  Patients are able to view lab/test results, encounter notes, upcoming appointments, etc.  Non-urgent messages can be sent to your provider as well.   To learn more about what you can do with MyChart, go to [email or web address].    Your next appointment:   1 month(s)  The format  for your next appointment:   In Person  Provider:   Berniece Salines,  DO   Other Instructions   Echocardiogram An echocardiogram is a test that uses sound waves (ultrasound) to produce images of the heart. Images from an echocardiogram can provide important information about:  Heart size and shape.  The size and thickness and movement of your heart's walls.  Heart muscle function and strength.  Heart valve function or if you have stenosis. Stenosis is when the heart valves are too narrow.  If blood is flowing backward through the heart valves (regurgitation).  A tumor or infectious growth around the heart valves.  Areas of heart muscle that are not working well because of poor blood flow or injury from a heart attack.  Aneurysm detection. An aneurysm is a weak or damaged part of an artery wall. The wall bulges out from the normal force of blood pumping through the body. Tell a health care provider about:  Any allergies you have.  All medicines you are taking, including vitamins, herbs, eye drops, creams, and over-the-counter medicines.  Any blood disorders you have.  Any surgeries you have had.  Any medical conditions you have.  Whether you are pregnant or may be pregnant. What are the risks? Generally, this is a safe test. However, problems may occur, including an allergic reaction to dye (contrast) that may be used during the test. What happens before the test? No specific preparation is needed. You may eat and drink normally. What happens during the test?  You will take off your clothes from the waist up and put on a hospital gown.  Electrodes or electrocardiogram (ECG)patches may be placed on your chest. The electrodes or patches are then connected to a device that monitors your heart rate and rhythm.  You will lie down on a table for an ultrasound exam. A gel will be applied to your chest to help sound waves pass through your skin.  A handheld device, called a  transducer, will be pressed against your chest and moved over your heart. The transducer produces sound waves that travel to your heart and bounce back (or "echo" back) to the transducer. These sound waves will be captured in real-time and changed into images of your heart that can be viewed on a video monitor. The images will be recorded on a computer and reviewed by your health care provider.  You may be asked to change positions or hold your breath for a short time. This makes it easier to get different views or better views of your heart.  In some cases, you may receive contrast through an IV in one of your veins. This can improve the quality of the pictures from your heart. The procedure may vary among health care providers and hospitals.   What can I expect after the test? You may return to your normal, everyday life, including diet, activities, and medicines, unless your health care provider tells you not to do that. Follow these instructions at home:  It is up to you to get the results of your test. Ask your health care provider, or the department that is doing the test, when your results will be ready.  Keep all follow-up visits. This is important. Summary  An echocardiogram is a test that uses sound waves (ultrasound) to produce images of the heart.  Images from an echocardiogram can provide important information about the size and shape of your heart, heart muscle function, heart valve function, and other possible heart problems.  You do not need to do anything to prepare before this test. You  may eat and drink normally.  After the echocardiogram is completed, you may return to your normal, everyday life, unless your health care provider tells you not to do that. This information is not intended to replace advice given to you by your health care provider. Make sure you discuss any questions you have with your health care provider. Document Revised: 10/03/2019 Document Reviewed:  10/03/2019 Elsevier Patient Education  2021 [place].   Cardiac CT Angiogram A cardiac CT angiogram is a procedure to look at the heart and the area around the heart. It may be done to help find the cause of chest pains or other symptoms of heart disease. During this procedure, a substance called contrast dye is injected into the blood vessels in the area to be checked. A large X-ray machine, called a CT scanner, then takes detailed pictures of the heart and the surrounding area. The procedure is also sometimes called a coronary CT angiogram, coronary artery scanning, or CTA. A cardiac CT angiogram allows the health care provider to see how well blood is flowing to and from the heart. The health care provider will be able to see if there are any problems, such as:  Blockage or narrowing of the coronary arteries in the heart.  Fluid around the heart.  Signs of weakness or disease in the muscles, valves, and tissues of the heart. Tell a health care provider about:  Any allergies you have. This is especially important if you have had a previous allergic reaction to contrast dye.  All medicines you are taking, including vitamins, herbs, eye drops, creams, and over-the-counter medicines.  Any blood disorders you have.  Any surgeries you have had.  Any medical conditions you have.  Whether you are pregnant or may be pregnant.  Any anxiety disorders, chronic pain, or other conditions you have that may increase your stress or prevent you from lying still. What are the risks? Generally, this is a safe procedure. However, problems may occur, including:  Bleeding.  Infection.  Allergic reactions to medicines or dyes.  Damage to other structures or organs.  Kidney damage from the contrast dye that is used.  Increased risk of cancer from radiation exposure. This risk is low. Talk with your health care provider about: ? The risks and benefits of testing. ? How you can receive the  lowest dose of radiation. What happens before the procedure?  Wear comfortable clothing and remove any jewelry, glasses, dentures, and hearing aids.  Follow instructions from your health care provider about eating and drinking. This may include: ? For 12 hours before the procedure - avoid caffeine. This includes tea, coffee, soda, energy drinks, and diet pills. Drink plenty of water or other fluids that do not have caffeine in them. Being well hydrated can prevent complications. ? For 4-6 hours before the procedure - stop eating and drinking. The contrast dye can cause nausea, but this is less likely if your stomach is empty.  Ask your health care provider about changing or stopping your regular medicines. This is especially important if you are taking diabetes medicines, blood thinners, or medicines to treat problems with erections (erectile dysfunction). What happens during the procedure?  Hair on your chest may need to be removed so that small sticky patches called electrodes can be placed on your chest. These will transmit information that helps to monitor your heart during the procedure.  An IV will be inserted into one of your veins.  You might be given a medicine  to control your heart rate during the procedure. This will help to ensure that good images are obtained.  You will be asked to lie on an exam table. This table will slide in and out of the CT machine during the procedure.  Contrast dye will be injected into the IV. You might feel warm, or you may get a metallic taste in your mouth.  You will be given a medicine called nitroglycerin. This will relax or dilate the arteries in your heart.  The table that you are lying on will move into the CT machine tunnel for the scan.  The person running the machine will give you instructions while the scans are being done. You may be asked to: ? Keep your arms above your head. ? Hold your breath. ? Stay very still, even if the table is  moving.  When the scanning is complete, you will be moved out of the machine.  The IV will be removed. The procedure may vary among health care providers and hospitals.   What can I expect after the procedure? After your procedure, it is common to have:  A metallic taste in your mouth from the contrast dye.  A feeling of warmth.  A headache from the nitroglycerin. Follow these instructions at home:  Take over-the-counter and prescription medicines only as told by your health care provider.  If you are told, drink enough fluid to keep your urine pale yellow. This will help to flush the contrast dye out of your body.  Most people can return to their normal activities right after the procedure. Ask your health care provider what activities are safe for you.  It is up to you to get the results of your procedure. Ask your health care provider, or the department that is doing the procedure, when your results will be ready.  Keep all follow-up visits as told by your health care provider. This is important. Contact a health care provider if:  You have any symptoms of allergy to the contrast dye. These include: ? Shortness of breath. ? Rash or hives. ? A racing heartbeat. Summary  A cardiac CT angiogram is a procedure to look at the heart and the area around the heart. It may be done to help find the cause of chest pains or other symptoms of heart disease.  During this procedure, a large X-ray machine, called a CT scanner, takes detailed pictures of the heart and the surrounding area after a contrast dye has been injected into blood vessels in the area.  Ask your health care provider about changing or stopping your regular medicines before the procedure. This is especially important if you are taking diabetes medicines, blood thinners, or medicines to treat erectile dysfunction.  If you are told, drink enough fluid to keep your urine pale yellow. This will help to flush the contrast dye  out of your body. This information is not intended to replace advice given to you by your health care provider. Make sure you discuss any questions you have with your health care provider. Document Revised: 10/05/2018 Document Reviewed: 10/05/2018 Elsevier Patient Education  2021 [place].  Hydrochlorothiazide Capsules or Tablets What is this medicine? HYDROCHLOROTHIAZIDE (hye droe klor oh THYE a zide) is a diuretic. It helps you make more urine and to lose salt and excess water from your body. It treats swelling from heart, kidney, or liver disease. It also treats high blood pressure. This medicine may be used for other purposes; ask your health  care provider or pharmacist if you have questions. COMMON BRAND NAME(S): Esidrix, Ezide, HydroDIURIL, Microzide, Oretic, Zide What should I tell my health care provider before I take this medicine? They need to know if you have any of these conditions:  diabetes  gout  kidney disease  liver disease  lupus  pancreatitis  an unusual or allergic reaction to hydrochlorothiazide, sulfa drugs, other medicines, foods, dyes, or preservatives  pregnant or trying to get pregnant  breast-feeding How should I use this medicine? Take this medicine by mouth. Take it as directed on the prescription label at the same time every day. You can take it with or without food. If it upsets your stomach, take it with food. Keep taking it unless your health care provider tells you to stop. Talk to your health care provider about the use of this medicine in children. While it may be prescribed for children as young as newborns for selected conditions, precautions do apply. Overdosage: If you think you have taken too much of this medicine contact a poison control center or emergency room at once. NOTE: This medicine is only for you. Do not share this medicine with others. What if I miss a dose? If you miss a dose, take it as soon as you can. If it is almost  time for your next dose, take only that dose. Do not take double or extra doses. What may interact with this medicine?  cholestyramine  colestipol  digoxin  dofetilide  lithium  medicines for blood pressure  medicines for diabetes  medicines that relax muscles for surgery  other diuretics  steroid medicines like prednisone or cortisone This list may not describe all possible interactions. Give your health care provider a list of all the medicines, herbs, non-prescription drugs, or dietary supplements you use. Also tell them if you smoke, drink alcohol, or use illegal drugs. Some items may interact with your medicine. What should I watch for while using this medicine? Visit your health care provider for regular check ups. Check your blood pressure as directed. Ask your health care provider what your blood pressure should be. Also, find out when you should contact him or her. Do not treat yourself for coughs, colds, or pain while you are using this medicine without asking your health care provider for advice. Some medicines may increase your blood pressure. You may get drowsy or dizzy. Do not drive, use machinery, or do anything that needs mental alertness until you know how this medicine affects you. Do not stand or sit up quickly, especially if you are an older patient. This reduces the risk of dizzy or fainting spells. Alcohol can make you more drowsy and dizzy. Avoid alcoholic drinks. Talk to your health care professional about your risk of skin cancer. You may be more at risk for skin cancer if you take this medicine. This medicine can make you more sensitive to the sun. Keep out of the sun. If you cannot avoid being in the sun, wear protective clothing and use sunscreen. Do not use sun lamps or tanning beds/booths. You may need to be on a special diet while taking this medicine. Ask your health care provider. Also, find out how many glasses of fluids you need to drink each  day. Check with your health care provider if you get an attack of severe diarrhea, nausea and vomiting, or if you sweat a lot. The loss of too much body fluid can make it dangerous for you to take this medicine. This  medicine may increase blood sugar. Ask your healthcare provider if changes in diet or medicines are needed if you have diabetes. What side effects may I notice from receiving this medicine? Side effects that you should report to your doctor or health care professional as soon as possible:  allergic reactions (skin rash, itching or hives; swelling of the face, lips, or tongue)  gout (severe pain, redness, or swelling in joints like the big toe)  high blood sugar (increased hunger, thirst or urination; unusually weak or tired; blurry vision)  kidney injury (trouble passing urine or change in the amount of urine)  low blood pressure (dizziness; feeling faint or lightheaded, falls; unusually weak or tired)  low potassium levels (trouble breathing; chest pain; dizziness; fast, irregular heartbeat; feeling faint or lightheaded, falls; muscle cramps or pain)  sudden change in vision or eye pain Side effects that usually do not require medical attention (report to your doctor or health care professional if they continue or are bothersome):  change in sex drive or performance  dry mouth  headache  stomach upset This list may not describe all possible side effects. Call your doctor for medical advice about side effects. You may report side effects to FDA at 1-800-FDA-1088. Where should I keep my medicine? Keep out of the reach of children and pets. Store at room temperature between 20 and 25 degrees C (68 and 77 degrees F). Protect from light and moisture. Keep the container tightly closed. Do not freeze. Get rid of any unused medicine after the expiration date. To get rid of medicines that are no longer needed or have expired:  Take the medicine to a medicine take-back program.  Check with your pharmacy or law enforcement to find a location.  If you cannot return the medicine, check the label or package insert to see if the medicine should be thrown out in the garbage or flushed down the toilet. If you are not sure, ask your health care provider. If it is safe to put in the trash, empty the medicine out of the container. Mix the medicine with cat litter, dirt, coffee grounds, or other unwanted substance. Seal the mixture in a bag or container. Put it in the trash. NOTE: This sheet is a summary. It may not cover all possible information. If you have questions about this medicine, talk to your doctor, pharmacist, or health care provider.  2021 Elsevier/Gold Standard (2019-12-20 17:16:00)     Adopting a Healthy Lifestyle.  Know what a healthy weight is for you (roughly BMI <25) and aim to maintain this   Aim for 7+ servings of fruits and vegetables daily   65-80+ fluid ounces of water or unsweet tea for healthy kidneys   Limit to max 1 drink of alcohol per day; avoid smoking/tobacco   Limit animal fats in diet for cholesterol and heart health - choose grass fed whenever available   Avoid highly processed foods, and foods high in saturated/trans fats   Aim for low stress - take time to unwind and care for your mental health   Aim for 150 min of moderate intensity exercise weekly for heart health, and weights twice weekly for bone health   Aim for 7-9 hours of sleep daily   When it comes to diets, agreement about the perfect plan isnt easy to find, even among the experts. Experts at the [employer] developed an idea known as the Healthy Eating Plate. Just imagine a plate divided into logical, healthy  portions.   The emphasis is on diet quality:   Load up on vegetables and fruits - one-half of your plate: Aim for color and variety, and remember that potatoes dont count.   Go for whole grains - one-quarter of your plate: Whole wheat,  barley, wheat berries, quinoa, oats, brown rice, and foods made with them. If you want pasta, go with whole wheat pasta.   Protein power - one-quarter of your plate: Fish, chicken, beans, and nuts are all healthy, versatile protein sources. Limit red meat.   The diet, however, does go beyond the plate, offering a few other suggestions.   Use healthy plant oils, such as olive, canola, soy, corn, sunflower and peanut. Check the labels, and avoid partially hydrogenated oil, which have unhealthy trans fats.   If youre thirsty, drink water. Coffee and tea are good in moderation, but skip sugary drinks and limit milk and dairy products to one or two daily servings.   The type of carbohydrate in the diet is more important than the amount. Some sources of carbohydrates, such as vegetables, fruits, whole grains, and beans-are healthier than others.   Finally, stay active  Signed, Crystal Salines, DO  03/14/2020 12:37 PM    [place]

## 2020-03-15 LAB — BASIC METABOLIC PANEL
BUN/Creatinine Ratio: 16 (ref 12–28)
BUN: 14 mg/dL (ref 8–27)
CO2: 25 mmol/L (ref 20–29)
Calcium: 10.1 mg/dL (ref 8.7–10.3)
Chloride: 100 mmol/L (ref 96–106)
Creatinine, Ser: 0.89 mg/dL (ref 0.57–1.00)
GFR calc Af Amer: 77 mL/min/{1.73_m2} (ref >59)
GFR calc non Af Amer: 67 mL/min/{1.73_m2} (ref >59)
Glucose: 103 mg/dL — ABNORMAL HIGH (ref 65–99)
Potassium: 3.9 mmol/L (ref 3.5–5.2)
Sodium: 138 mmol/L (ref 134–144)

## 2020-03-15 LAB — MAGNESIUM: Magnesium: 1.4 mg/dL — ABNORMAL LOW (ref 1.6–2.3)

## 2020-03-15 MED ORDER — MAGNESIUM OXIDE 400 MG PO CAPS: 400.0000 mg | ORAL_CAPSULE | Freq: Two times a day (BID) | ORAL | 0 refills | Status: DC

## 2020-03-15 NOTE — Addendum Note (Signed)
Addended by: Truddie Hidden on: 03/15/2020 11:04 AM   Modules accepted: Orders

## 2020-03-18 ENCOUNTER — Telehealth (HOSPITAL_COMMUNITY): Payer: Self-pay | Admitting: Emergency Medicine

## 2020-03-18 MED ORDER — MAGNESIUM OXIDE 400 MG PO CAPS: 400.0000 mg | ORAL_CAPSULE | Freq: Two times a day (BID) | ORAL | 0 refills | Status: DC

## 2020-03-18 NOTE — Telephone Encounter (Signed)
Pt calling stating the pharmacy did not receive an rx for magnesium  I called her pharm to confirm, they suggested we re-sent the request.   I will try however [employer] are having issues at this time.  I will also suggest to the patient that magnesium oxide is available OTC and to take 400mg  BID.  Marchia Bond RN Navigator Cardiac Imaging [facility] [phone] Office  [phone] Cell

## 2020-03-19 ENCOUNTER — Telehealth: Payer: Self-pay

## 2020-03-19 DIAGNOSIS — I10 Essential (primary) hypertension: Secondary | ICD-10-CM

## 2020-03-19 DIAGNOSIS — R600 Localized edema: Secondary | ICD-10-CM

## 2020-03-19 DIAGNOSIS — R9431 Abnormal electrocardiogram [ECG] [EKG]: Secondary | ICD-10-CM

## 2020-03-19 NOTE — Telephone Encounter (Signed)
-----   Message from Berniece Salines, DO sent at 03/19/2020  2:18 PM EST ----- She need blood work in 1 week BMP and mag ----- Message ----- From: Darrel Reach, CMA Sent: 03/18/2020   3:50 PM EST To: Berniece Salines, DO  What is needed on this patient?

## 2020-03-19 NOTE — Telephone Encounter (Signed)
Left a message to return my call. Orders entered.

## 2020-03-20 ENCOUNTER — Telehealth: Payer: Self-pay

## 2020-03-20 NOTE — Telephone Encounter (Signed)
-----   Message from Kardie Tobb, DO sent at 03/19/2020  2:18 PM EST ----- She need blood work in 1 week BMP and mag ----- Message ----- From: Waneta Fitting M, CMA Sent: 03/18/2020   3:50 PM EST To: Kardie Tobb, DO  What is needed on this patient?   

## 2020-03-20 NOTE — Telephone Encounter (Signed)
Left another message to return my call.  

## 2020-03-26 ENCOUNTER — Telehealth: Payer: Self-pay

## 2020-03-26 NOTE — Telephone Encounter (Signed)
Left another message to return my call.  

## 2020-03-26 NOTE — Telephone Encounter (Signed)
-----   Message from Kardie Tobb, DO sent at 03/19/2020  2:18 PM EST ----- She need blood work in 1 week BMP and mag ----- Message ----- From: Rhylee Pucillo M, CMA Sent: 03/18/2020   3:50 PM EST To: Kardie Tobb, DO  What is needed on this patient?   

## 2020-03-28 ENCOUNTER — Telehealth: Payer: Self-pay

## 2020-03-28 NOTE — Telephone Encounter (Signed)
Pt called in returning [place] call   Best number [phone]

## 2020-03-28 NOTE — Telephone Encounter (Signed)
Attempt several times reach the patient, no response yet. I mailed a letter requesting a call back. I will await patient call.

## 2020-03-28 NOTE — Telephone Encounter (Signed)
-----   Message from Kardie Tobb, DO sent at 03/19/2020  2:18 PM EST ----- She need blood work in 1 week BMP and mag ----- Message ----- From: Bhumi Godbey M, CMA Sent: 03/18/2020   3:50 PM EST To: Kardie Tobb, DO  What is needed on this patient?   

## 2020-03-28 NOTE — Telephone Encounter (Signed)
I called the provided number but this was not the patient(wrong number)

## 2020-03-29 ENCOUNTER — Telehealth (HOSPITAL_COMMUNITY): Payer: Self-pay | Admitting: *Deleted

## 2020-03-29 NOTE — Telephone Encounter (Signed)
Reaching out to patient to offer assistance regarding upcoming cardiac imaging study; pt verbalizes understanding of appt date/time, parking situation and where to check in, pre-test NPO status and medications ordered, and verified current allergies; name and call back number provided for further questions should they arise  Harley Fitzwater RN Navigator Cardiac Imaging  Heart and Vascular [phone] office [phone] cell  

## 2020-04-01 ENCOUNTER — Other Ambulatory Visit: Payer: Self-pay

## 2020-04-01 ENCOUNTER — Ambulatory Visit (HOSPITAL_COMMUNITY)
Admission: RE | Admit: 2020-04-01 | Discharge: 2020-04-01 | Disposition: A | Discharge status: Home / Self Care | Payer: Medicare Other | Source: Ambulatory Visit | Attending: Cardiology | Admitting: Cardiology

## 2020-04-01 DIAGNOSIS — R9431 Abnormal electrocardiogram [ECG] [EKG]: Secondary | ICD-10-CM | POA: Diagnosis present

## 2020-04-01 MED ORDER — DILTIAZEM HCL 25 MG/5ML IV SOLN
5.0000 mg | INTRAVENOUS | Status: DC | PRN
  Filled 2020-04-01: qty 5

## 2020-04-01 MED ORDER — METOPROLOL TARTRATE 5 MG/5ML IV SOLN
INTRAVENOUS | Status: AC
  Administered 2020-04-01: 5 mg via INTRAVENOUS
  Filled 2020-04-01: qty 10

## 2020-04-01 MED ORDER — NITROGLYCERIN 0.4 MG SL SUBL
SUBLINGUAL_TABLET | SUBLINGUAL | Status: AC
  Administered 2020-04-01: 0.8 mg
  Filled 2020-04-01: qty 2

## 2020-04-01 MED ORDER — IOHEXOL 350 MG/ML SOLN
80.0000 mL | Freq: Once | INTRAVENOUS | Status: AC | PRN
  Administered 2020-04-01: 80 mL via INTRAVENOUS

## 2020-04-01 MED ORDER — DILTIAZEM HCL 25 MG/5ML IV SOLN
INTRAVENOUS | Status: AC
  Administered 2020-04-01: 5 mg via INTRAVENOUS
  Filled 2020-04-01: qty 5

## 2020-04-01 MED ORDER — NITROGLYCERIN 0.4 MG SL SUBL: 0.8000 mg | SUBLINGUAL_TABLET | Freq: Once | SUBLINGUAL | Status: AC

## 2020-04-01 MED ORDER — METOPROLOL TARTRATE 5 MG/5ML IV SOLN
5.0000 mg | INTRAVENOUS | Status: AC | PRN
  Administered 2020-04-01: 5 mg via INTRAVENOUS

## 2020-04-02 ENCOUNTER — Other Ambulatory Visit: Payer: Self-pay

## 2020-04-02 MED ORDER — ROSUVASTATIN CALCIUM 5 MG PO TABS: 5.0000 mg | ORAL_TABLET | Freq: Every day | ORAL | 3 refills | Status: AC

## 2020-04-04 ENCOUNTER — Telehealth: Payer: Self-pay | Admitting: Cardiology

## 2020-04-04 NOTE — Telephone Encounter (Signed)
Spoke to the patient just now and let her know that in my system it says that she is a female. However, her CT report does say that she is a 70 year old female with chest pain. I will forward this to Dr. Harriet Masson to fix this.

## 2020-04-04 NOTE — Telephone Encounter (Signed)
Patient states when she logs onto MyChart it says that she is a 70 y/o female.  She states all other information is accurate, but she is requesting to have the gender corrected.  Is anyone able to assist with this? Please advise.

## 2020-04-05 ENCOUNTER — Other Ambulatory Visit: Payer: Self-pay

## 2020-04-05 ENCOUNTER — Ambulatory Visit (HOSPITAL_BASED_OUTPATIENT_CLINIC_OR_DEPARTMENT_OTHER)
Admission: RE | Admit: 2020-04-05 | Discharge: 2020-04-05 | Disposition: A | Discharge status: Home / Self Care | Payer: Medicare Other | Source: Ambulatory Visit | Attending: Cardiology | Admitting: Cardiology

## 2020-04-05 DIAGNOSIS — R600 Localized edema: Secondary | ICD-10-CM

## 2020-04-05 DIAGNOSIS — I10 Essential (primary) hypertension: Secondary | ICD-10-CM

## 2020-04-05 DIAGNOSIS — R9431 Abnormal electrocardiogram [ECG] [EKG]: Secondary | ICD-10-CM

## 2020-04-05 LAB — ECHOCARDIOGRAM COMPLETE
Area-P 1/2: 7.37 cm2
S' Lateral: 3.38 cm

## 2020-04-05 NOTE — Telephone Encounter (Signed)
Made an addendum to the patient ct result. I called and apologize for inadvertently typing female - the patient is a 70 year old female. She accepted my apology.

## 2020-04-11 ENCOUNTER — Other Ambulatory Visit: Payer: Self-pay

## 2020-04-11 ENCOUNTER — Encounter: Payer: Self-pay | Admitting: Cardiology

## 2020-04-11 ENCOUNTER — Ambulatory Visit (INDEPENDENT_AMBULATORY_CARE_PROVIDER_SITE_OTHER): Payer: Medicare Other | Admitting: Cardiology

## 2020-04-11 VITALS — BP 132/88 | HR 103 | Ht 65.0 in | Wt 181.0 lb

## 2020-04-11 DIAGNOSIS — I2510 Atherosclerotic heart disease of native coronary artery without angina pectoris: Secondary | ICD-10-CM | POA: Diagnosis not present

## 2020-04-11 DIAGNOSIS — I5189 Other ill-defined heart diseases: Secondary | ICD-10-CM

## 2020-04-11 DIAGNOSIS — I10 Essential (primary) hypertension: Secondary | ICD-10-CM | POA: Diagnosis not present

## 2020-04-11 DIAGNOSIS — E669 Obesity, unspecified: Secondary | ICD-10-CM

## 2020-04-11 NOTE — Progress Notes (Signed)
Cardiology Office Note:    Date:  04/11/2020   ID:  Crystal Wolf, DOB [birth year], MRN [id number]  PCP:  Marzetta Board, NP  Cardiologist:  Berniece Salines, DO  Electrophysiologist:  None   Referring MD: No ref. provider found   Chief Complaint  Patient presents with  . Follow-up   History of Present Illness:    Crystal Wolf is a 70 y.o. 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  I did see the patient for the first time on March 14, 2020 at that time she was hypertensive and was only taking amlodipine 5 mg daily.  She also had an abnormal EKG.  At the conclusion of her visit I added hydrochlorothiazide to her medication regimen and due to her bilateral leg edema an echocardiogram was ordered and for her abnormal EKG we will set the patient up for coronary CTA to rule out coronary artery disease.  She was able to get all her testing done she is here today for follow-up visit.  Since I saw the patient she has been doing well.  Her leg edema has improved since being on the hydrochlorothiazide.  Past Medical History:  Diagnosis Date  . Iron deficiency anemia 03/29/2007  . 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 Pulmonary emboli ([place])   . Screening for colon cancer 05/16/2007   Formatting of this note might be different from the original. colonoscopy 8/09 normal.  Next colorectal cancer screening due Aug 2019  . Submucous uterine fibroid 06/14/2007  . Uterine cancer ([place])     History reviewed. No pertinent surgical history.  Current Medications: Current Meds  Medication Sig  . amLODipine (NORVASC) 5 MG tablet Take 1 tablet (5 mg total) by mouth daily.  Marland Kitchen ELIQUIS 5 MG TABS tablet Take 5 mg by mouth 2 (two) times daily.  .  hydrochlorothiazide (HYDRODIURIL) 25 MG tablet Take 1 tablet (25 mg total) by mouth daily.  . rosuvastatin (CRESTOR) 5 MG tablet Take 1 tablet (5 mg total) by mouth daily.     Allergies:   Codeine   Social History   Socioeconomic History  . Marital status: Single    Spouse name: Not on file  . Number of children: Not on file  . Years of education: Not on file  . Highest education level: Not on file  Occupational History  . Not on file  Tobacco Use  . Smoking status: Never Smoker  . Smokeless tobacco: Never Used  Vaping Use  . Vaping Use: Never used  Substance and Sexual Activity  . Alcohol use: Yes    Comment: seldom  . Drug use: Never  . Sexual activity: Not on file  Other Topics Concern  . Not on file  Social History Narrative  . Not on file   Social Determinants of Health   Financial Resource Strain: Not on file  Food Insecurity: Not on file  Transportation Needs: Not on file  Physical Activity: Not on file  Stress: Not on file  Social Connections: Not on file     Family History: The patient's family history includes Congenital heart disease in her mother; Heart failure in her father.  ROS:   Review of Systems  Constitution: Negative for decreased appetite, fever and weight gain.  HENT: Negative for congestion,  ear discharge, hoarse voice and sore throat.   Eyes: Negative for discharge, redness, vision loss in right eye and visual halos.  Cardiovascular: Negative for chest pain, dyspnea on exertion, leg swelling, orthopnea and palpitations.  Respiratory: Negative for cough, hemoptysis, shortness of breath and snoring.   Endocrine: Negative for heat intolerance and polyphagia.  Hematologic/Lymphatic: Negative for bleeding problem. Does not bruise/bleed easily.  Skin: Negative for flushing, nail changes, rash and suspicious lesions.  Musculoskeletal: Negative for arthritis, joint pain, muscle cramps, myalgias, neck pain and stiffness.  Gastrointestinal:  Negative for abdominal pain, bowel incontinence, diarrhea and excessive appetite.  Genitourinary: Negative for decreased libido, genital sores and incomplete emptying.  Neurological: Negative for brief paralysis, focal weakness, headaches and loss of balance.  Psychiatric/Behavioral: Negative for altered mental status, depression and suicidal ideas.  Allergic/Immunologic: Negative for HIV exposure and persistent infections.    EKGs/Labs/Other Studies Reviewed:    The following studies were reviewed today:   EKG: None today  Coronary CTA Aorta: Normal size.  No calcifications.  No dissection.  Aortic Valve:  Trileaflet.  No calcifications.  Coronary calcium score 74  Coronary Arteries:  Normal coronary origin.  Left dominance.  RCA is a large dominant artery that gives rise to PDA and PLVB. There is no plaque.  Left main is a large artery that gives rise to LAD, Ramus Intermedius and LCX arteries.  LAD is a large vessel. There is a mild (25-49%) proximal calcified plaques. There is mild LAD calcified plaque in the mid LAD. The  distal LAD with no plaques.  LCX is a dominant artery that gives rise to one large OM1 branch. There is no plaque.  Other findings:  Normal pulmonary vein drainage into the left atrium.  Normal left atrial appendage without a thrombus.  Normal size of the pulmonary artery.  IMPRESSION: 1. Coronary calcium score of 74. This was 51 percentile for age and sex matched control.  2. Normal coronary origin with Left dominance.  3. Mild CAD. CADRADs 2. Aggressive medical management is recommended.  Berniece Salines, DO   Electronically Signed   By: Berniece Salines DO   On: 04/05/2020 15:33  Transthoracic echocardiogram IMPRESSIONS  1. Left ventricular ejection fraction, by estimation, is 60 to 65%. The  left ventricle has normal function. The left ventricle has no regional  wall motion abnormalities. There is severe asymmetric  left ventricular  hypertrophy. Left ventricular diastolic  parameters are consistent with Grade II diastolic dysfunction  (pseudonormalization).  2. Right ventricular systolic function is normal. The right ventricular  size is normal.  3. The mitral valve is normal in structure. Trivial mitral valve  regurgitation. No evidence of mitral stenosis.  4. The aortic valve is normal in structure. Aortic valve regurgitation is  not visualized. Mild aortic valve sclerosis is present, with no evidence  of aortic valve stenosis.  5. The inferior vena cava is normal in size with greater than 50%  respiratory variability, suggesting right atrial pressure of 3 mmHg.   FINDINGS  Left Ventricle: Left ventricular ejection fraction, by estimation, is 60  to 65%. The left ventricle has normal function. The left ventricle has no  regional wall motion abnormalities. The left ventricular internal cavity  size was normal in size. There is  severe asymmetric left ventricular hypertrophy. Left ventricular  diastolic parameters are consistent with Grade II diastolic dysfunction  (pseudonormalization).   Right Ventricle: The right ventricular size is normal. No increase in  right ventricular wall thickness. Right  ventricular systolic function is  normal.   Left Atrium: Left atrial size was normal in size.   Right Atrium: Right atrial size was normal in size.   Pericardium: There is no evidence of pericardial effusion. Presence of  pericardial fat pad.   Mitral Valve: The mitral valve is normal in structure. Trivial mitral  valve regurgitation. No evidence of mitral valve stenosis.   Tricuspid Valve: The tricuspid valve is normal in structure. Tricuspid  valve regurgitation is not demonstrated. No evidence of tricuspid  stenosis.   Aortic Valve: The aortic valve is normal in structure. Aortic valve  regurgitation is not visualized. Mild aortic valve sclerosis is present,  with no evidence of  aortic valve stenosis.   Pulmonic Valve: The pulmonic valve was normal in structure. Pulmonic valve  regurgitation is not visualized. No evidence of pulmonic stenosis.   Aorta: The aortic root is normal in size and structure.   Venous: The inferior vena cava is normal in size with greater than 50%  respiratory variability, suggesting right atrial pressure of 3 mmHg.   IAS/Shunts: No atrial level shunt detected by color flow Doppler.   Recent Labs: 03/06/2020: Hemoglobin 11.6; Platelets 136 03/14/2020: BUN 14; Creatinine, Ser 0.89; Magnesium 1.4; Potassium 3.9; Sodium 138  Recent Lipid Panel No results found for: CHOL, TRIG, HDL, CHOLHDL, VLDL, LDLCALC, LDLDIRECT  Physical Exam:    VS:  BP 132/88 (BP Location: Left Arm, Patient Position: Sitting, Cuff Size: Normal)   Pulse (!) 103   Ht 5\' 5"  (1.651 m)   Wt 181 lb (82.1 kg)   SpO2 98%   BMI 30.12 kg/m     Wt Readings from Last 3 Encounters:  04/11/20 181 lb (82.1 kg)  03/14/20 189 lb (85.7 kg)  03/05/20 194 lb (88 kg)     GEN: Well nourished, well developed in no acute distress HEENT: Normal NECK: No JVD; No carotid bruits LYMPHATICS: No lymphadenopathy CARDIAC: S1S2 noted,RRR, no murmurs, rubs, gallops RESPIRATORY:  Clear to auscultation without rales, wheezing or rhonchi  ABDOMEN: Soft, non-tender, non-distended, +bowel sounds, no guarding. EXTREMITIES: No edema, No cyanosis, no clubbing MUSCULOSKELETAL:  No deformity  SKIN: Warm and dry NEUROLOGIC:  Alert and oriented x 3, non-focal PSYCHIATRIC:  Normal affect, good insight  ASSESSMENT:    1. Essential hypertension   2. Mild coronary artery disease   3. Diastolic dysfunction   4. Obesity (BMI 30-39.9)    PLAN:     Her blood pressure has improved and is acceptable at this time.  She will remain on her Norvasc 5 mg daily and her hydrochlorothiazide 25 mg daily.  For coronary artery disease she like to wait until after all of her surgeries before starting her  aspirin and Crestor.  I think this is unreasonable.  I discussed with the patient what it means to have diastolic dysfunction I educated her on this.  The patient understands the need to lose weight with diet and exercise. We have discussed specific strategies for this.  She is planning to go back to [place] at the end of the month but this is still pending.  We did give the patient information about her coronary CTA as well as her echocardiogram-reports were printed.  The patient is in agreement with the above plan. The patient left the office in stable condition.  The patient will follow up in as needed due to the fact that the patient may be going back to [place].   Medication Adjustments/Labs and Tests Ordered: Current  medicines are reviewed at length with the patient today.  Concerns regarding medicines are outlined above.  No orders of the defined types were placed in this encounter.  No orders of the defined types were placed in this encounter.   Patient Instructions  Medication Instructions:  Your physician recommends that you continue on your current medications as directed. Please refer to the Current Medication list given to you today.  *If you need a refill on your cardiac medications before your next appointment, please call your pharmacy*   Lab Work: None If you have labs (blood work) drawn today and your tests are completely normal, you will receive your results only by: [place] Kitchen MyChart Message (if you have MyChart) OR . A paper copy in the mail If you have any lab test that is abnormal or we need to change your treatment, we will call you to review the results.   Testing/Procedures: None   Follow-Up: At [facility], you and your health needs are our priority.  As part of our continuing mission to provide you with exceptional heart care, we have created designated Provider Care Teams.  These Care Teams include your primary Cardiologist (physician) and Advanced  Practice Providers (APPs -  Physician Assistants and Nurse Practitioners) who all work together to provide you with the care you need, when you need it.  We recommend signing up for the patient portal called "MyChart".  Sign up information is provided on this After Visit Summary.  MyChart is used to connect with patients for Virtual Visits (Telemedicine).  Patients are able to view lab/test results, encounter notes, upcoming appointments, etc.  Non-urgent messages can be sent to your provider as well.   To learn more about what you can do with MyChart, go to [email or web address].    Your next appointment:   As needed  The format for your next appointment:   In Person  Provider:   Berniece Salines, DO   Other Instructions      Adopting a Healthy Lifestyle.  Know what a healthy weight is for you (roughly BMI <25) and aim to maintain this   Aim for 7+ servings of fruits and vegetables daily   65-80+ fluid ounces of water or unsweet tea for healthy kidneys   Limit to max 1 drink of alcohol per day; avoid smoking/tobacco   Limit animal fats in diet for cholesterol and heart health - choose grass fed whenever available   Avoid highly processed foods, and foods high in saturated/trans fats   Aim for low stress - take time to unwind and care for your mental health   Aim for 150 min of moderate intensity exercise weekly for heart health, and weights twice weekly for bone health   Aim for 7-9 hours of sleep daily   When it comes to diets, agreement about the perfect plan isnt easy to find, even among the experts. Experts at the [employer] developed an idea known as the Healthy Eating Plate. Just imagine a plate divided into logical, healthy portions.   The emphasis is on diet quality:   Load up on vegetables and fruits - one-half of your plate: Aim for color and variety, and remember that potatoes dont count.   Go for whole grains - one-quarter of your plate:  Whole wheat, barley, wheat berries, quinoa, oats, brown rice, and foods made with them. If you want pasta, go with whole wheat pasta.   Protein power - one-quarter of your plate: Fish, chicken,  beans, and nuts are all healthy, versatile protein sources. Limit red meat.   The diet, however, does go beyond the plate, offering a few other suggestions.   Use healthy plant oils, such as olive, canola, soy, corn, sunflower and peanut. Check the labels, and avoid partially hydrogenated oil, which have unhealthy trans fats.   If youre thirsty, drink water. Coffee and tea are good in moderation, but skip sugary drinks and limit milk and dairy products to one or two daily servings.   The type of carbohydrate in the diet is more important than the amount. Some sources of carbohydrates, such as vegetables, fruits, whole grains, and beans-are healthier than others.   Finally, stay active  Signed, Berniece Salines, DO  04/11/2020 11:14 AM    [place]

## 2020-04-11 NOTE — Patient Instructions (Signed)

## 2020-12-31 ENCOUNTER — Ambulatory Visit: Payer: Medicare Other | Admitting: Cardiology

## 2021-01-21 ENCOUNTER — Ambulatory Visit (INDEPENDENT_AMBULATORY_CARE_PROVIDER_SITE_OTHER): Payer: Medicare Other | Admitting: Cardiology

## 2021-01-21 ENCOUNTER — Encounter: Payer: Self-pay | Admitting: Cardiology

## 2021-01-21 ENCOUNTER — Other Ambulatory Visit: Payer: Self-pay

## 2021-01-21 VITALS — BP 148/96 | HR 74 | Ht 65.0 in | Wt 173.2 lb

## 2021-01-21 DIAGNOSIS — I10 Essential (primary) hypertension: Secondary | ICD-10-CM | POA: Diagnosis not present

## 2021-01-21 DIAGNOSIS — R0602 Shortness of breath: Secondary | ICD-10-CM

## 2021-01-21 DIAGNOSIS — I2510 Atherosclerotic heart disease of native coronary artery without angina pectoris: Secondary | ICD-10-CM | POA: Diagnosis not present

## 2021-01-21 DIAGNOSIS — E669 Obesity, unspecified: Secondary | ICD-10-CM

## 2021-01-21 DIAGNOSIS — I2782 Chronic pulmonary embolism: Secondary | ICD-10-CM | POA: Diagnosis not present

## 2021-01-21 DIAGNOSIS — I5189 Other ill-defined heart diseases: Secondary | ICD-10-CM

## 2021-01-21 MED ORDER — CARVEDILOL 3.125 MG PO TABS: 3.1250 mg | ORAL_TABLET | Freq: Two times a day (BID) | ORAL | 3 refills | Status: DC

## 2021-01-21 NOTE — Patient Instructions (Addendum)
Medication Instructions:  Your physician has recommended you make the following change in your medication:  START: Coreg (Carvedilol) 3.125 mg twice daily  *If you need a refill on your cardiac medications before your next appointment, please call your pharmacy*   Lab Work: None If you have labs (blood work) drawn today and your tests are completely normal, you will receive your results only by: [place] (if you have MyChart) OR A paper copy in the mail If you have any lab test that is abnormal or we need to change your treatment, we will call you to review the results.   Testing/Procedures: Your physician has requested that you have an echocardiogram. Echocardiography is a painless test that uses sound waves to create images of your heart. It provides your doctor with information about the size and shape of your heart and how well your heart's chambers and valves are working. This procedure takes approximately one hour. There are no restrictions for this procedure.    Follow-Up: At [facility], you and your health needs are our priority.  As part of our continuing mission to provide you with exceptional heart care, we have created designated Provider Care Teams.  These Care Teams include your primary Cardiologist (physician) and Advanced Practice Providers (APPs -  Physician Assistants and Nurse Practitioners) who all work together to provide you with the care you need, when you need it.  We recommend signing up for the patient portal called "MyChart".  Sign up information is provided on this After Visit Summary.  MyChart is used to connect with patients for Virtual Visits (Telemedicine).  Patients are able to view lab/test results, encounter notes, upcoming appointments, etc.  Non-urgent messages can be sent to your provider as well.   To learn more about what you can do with MyChart, go to [email or web address].    Your next appointment:   6 month(s)  The format for your  next appointment:   In Person  Provider:   Berniece Salines, DO     Other Instructions  Echocardiogram An echocardiogram is a test that uses sound waves (ultrasound) to produce images of the heart. Images from an echocardiogram can provide important information about: Heart size and shape. The size and thickness and movement of your heart's walls. Heart muscle function and strength. Heart valve function or if you have stenosis. Stenosis is when the heart valves are too narrow. If blood is flowing backward through the heart valves (regurgitation). A tumor or infectious growth around the heart valves. Areas of heart muscle that are not working well because of poor blood flow or injury from a heart attack. Aneurysm detection. An aneurysm is a weak or damaged part of an artery wall. The wall bulges out from the normal force of blood pumping through the body. Tell a health care provider about: Any allergies you have. All medicines you are taking, including vitamins, herbs, eye drops, creams, and over-the-counter medicines. Any blood disorders you have. Any surgeries you have had. Any medical conditions you have. Whether you are pregnant or may be pregnant. What are the risks? Generally, this is a safe test. However, problems may occur, including an allergic reaction to dye (contrast) that may be used during the test. What happens before the test? No specific preparation is needed. You may eat and drink normally. What happens during the test?  You will take off your clothes from the waist up and put on a hospital gown. Electrodes or electrocardiogram (ECG)patches may be placed on  your chest. The electrodes or patches are then connected to a device that monitors your heart rate and rhythm. You will lie down on a table for an ultrasound exam. A gel will be applied to your chest to help sound waves pass through your skin. A handheld device, called a transducer, will be pressed against your chest  and moved over your heart. The transducer produces sound waves that travel to your heart and bounce back (or "echo" back) to the transducer. These sound waves will be captured in real-time and changed into images of your heart that can be viewed on a video monitor. The images will be recorded on a computer and reviewed by your health care provider. You may be asked to change positions or hold your breath for a short time. This makes it easier to get different views or better views of your heart. In some cases, you may receive contrast through an IV in one of your veins. This can improve the quality of the pictures from your heart. The procedure may vary among health care providers and hospitals. What can I expect after the test? You may return to your normal, everyday life, including diet, activities, and medicines, unless your health care provider tells you not to do that. Follow these instructions at home: It is up to you to get the results of your test. Ask your health care provider, or the department that is doing the test, when your results will be ready. Keep all follow-up visits. This is important. Summary An echocardiogram is a test that uses sound waves (ultrasound) to produce images of the heart. Images from an echocardiogram can provide important information about the size and shape of your heart, heart muscle function, heart valve function, and other possible heart problems. You do not need to do anything to prepare before this test. You may eat and drink normally. After the echocardiogram is completed, you may return to your normal, everyday life, unless your health care provider tells you not to do that. This information is not intended to replace advice given to you by your health care provider. Make sure you discuss any questions you have with your health care provider. Document Revised: 10/23/2020 Document Reviewed: 10/03/2019 Elsevier Patient Education  2022 [employer].

## 2021-01-21 NOTE — Progress Notes (Signed)
Cardiology Office Note:    Date:  01/22/2021   ID:  Crystal Wolf, DOB 04-Aug-1950, MRN [id number]  PCP:  Marzetta Board, NP  Cardiologist:  Berniece Salines, DO  Electrophysiologist:  None   Referring MD: Marzetta Board, NP   " I have been experiencing elevated blood pressure"   History of Present Illness:    Crystal Wolf is a 70 y.o. 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   I did see the patient for the first time on March 14, 2020 at that time she was hypertensive and was only taking amlodipine 5 mg daily.  She also had an abnormal EKG.  At the conclusion of her visit I added hydrochlorothiazide to her medication regimen and due to her bilateral leg edema an echocardiogram was ordered and for her abnormal EKG we will set the patient up for coronary CTA to rule out coronary artery disease.  I last saw the patient on April 11, 2020 at that time she appears to be doing well.  Her blood pressure was improved on the Norvasc and hydrochlorothiazide.  At that visit she did tell me she was planning on moving back to [place] and she was planning surgery.  Since I saw the patient she has been to [place], started neoadjuvant chemotherapy consisted of 5 doses of Doxil Avastin which was administered through March 2022.  She also underwent radical abdominal hysterectomy, BSO, Alm ectomy and debulking on April 30, 2020.  She then had chemotherapy with paclitaxel and carboplatin with bevacizumab from Jul 16, 2020, 1 cycle then the patient was initiated on maintenance bevacizumab 15 mg/kg grams every 3 weeks.  She is back in [place] she is here for follow-up visit.  The Loprofin that she has is the elevated blood pressure.  No chest pain or shortness of breath.  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 colonoscopy 8/09 normal.  Next colorectal cancer screening due Aug 2019   Submucous uterine fibroid 06/14/2007   Uterine cancer (HCC)     No past surgical history on file.  Current Medications: Current Meds  Medication Sig   amLODipine (NORVASC) 10 MG tablet Take 10 mg by mouth daily.   carvedilol (COREG) 3.125 MG tablet Take 1 tablet (3.125 mg total) by mouth 2 (two) times daily.   Ferrous Sulfate (IRON PO) Take by mouth.   hydrochlorothiazide (HYDRODIURIL) 25 MG tablet Take 1 tablet (25 mg total) by mouth daily.   KLOR-CON M10 10 MEQ tablet Take 20 mEq by mouth 2 (two) times daily.   MAGNESIUM PO Take 400 mg by mouth.   Multiple Vitamin (MULTIVITAMIN) capsule Take 1 capsule by mouth daily.   rosuvastatin (CRESTOR) 5 MG tablet Take 1 tablet (5 mg total) by mouth daily.   XARELTO 20 MG TABS tablet SMARTSIG:1 Tablet(s) By Mouth Every Evening     Allergies:   Codeine   Social History   Socioeconomic History   Marital status: Single    Spouse name: Not on file   Number of children: Not on file   Years of  education: Not on file   Highest education level: Not on file  Occupational History   Not on file  Tobacco Use   Smoking status: Never   Smokeless tobacco: Never  Vaping Use   Vaping Use: Never used  Substance and Sexual Activity   Alcohol use: Yes    Comment: seldom   Drug use: Never   Sexual activity: Not on file  Other Topics Concern   Not on file  Social History Narrative   Not on file   Social Determinants of Health   Financial Resource Strain: Not on file  Food Insecurity: Not on file  Transportation Needs: Not on file  Physical Activity: Not on file  Stress: Not on file  Social  Connections: Not on file     Family History: The patient's family history includes Congenital heart disease in her mother; Heart failure in her father.  ROS:   Review of Systems  Constitution: Negative for decreased appetite, fever and weight gain.  HENT: Negative for congestion, ear discharge, hoarse voice and sore throat.   Eyes: Negative for discharge, redness, vision loss in right eye and visual halos.  Cardiovascular: Negative for chest pain, dyspnea on exertion, leg swelling, orthopnea and palpitations.  Respiratory: Negative for cough, hemoptysis, shortness of breath and snoring.   Endocrine: Negative for heat intolerance and polyphagia.  Hematologic/Lymphatic: Negative for bleeding problem. Does not bruise/bleed easily.  Skin: Negative for flushing, nail changes, rash and suspicious lesions.  Musculoskeletal: Negative for arthritis, joint pain, muscle cramps, myalgias, neck pain and stiffness.  Gastrointestinal: Negative for abdominal pain, bowel incontinence, diarrhea and excessive appetite.  Genitourinary: Negative for decreased libido, genital sores and incomplete emptying.  Neurological: Negative for brief paralysis, focal weakness, headaches and loss of balance.  Psychiatric/Behavioral: Negative for altered mental status, depression and suicidal ideas.  Allergic/Immunologic: Negative for HIV exposure and persistent infections.    EKGs/Labs/Other Studies Reviewed:    The following studies were reviewed today:   EKG:  The ekg ordered today demonstrates sinus rhythm   Coronary CTA Aorta: Normal size.  No calcifications.  No dissection.   Aortic Valve:  Trileaflet.  No calcifications.   Coronary calcium score 74   Coronary Arteries:  Normal coronary origin.  Left dominance.   RCA is a large dominant artery that gives rise to PDA and PLVB. There is no plaque.   Left main is a large artery that gives rise to LAD, Ramus Intermedius and LCX arteries.   LAD is a  large vessel. There is a mild (25-49%) proximal calcified plaques. There is mild LAD calcified plaque in the mid LAD. The   distal LAD with no plaques.   LCX is a dominant artery that gives rise to one large OM1 branch. There is no plaque.   Other findings:   Normal pulmonary vein drainage into the left atrium.   Normal left atrial appendage without a thrombus.   Normal size of the pulmonary artery.   IMPRESSION: 1. Coronary calcium score of 74. This was 76 percentile for age and sex matched control.   2. Normal coronary origin with Left dominance.   3. Mild CAD. CADRADs 2. Aggressive medical management is recommended.   Berniece Salines, DO     Electronically Signed   By: Berniece Salines DO   On: 04/05/2020 15:33   Transthoracic echocardiogram IMPRESSIONS   1. Left ventricular ejection fraction, by estimation, is 60 to 65%. The  left ventricle has normal function. The left ventricle has  no regional  wall motion abnormalities. 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 Left ventricular diastolic   parameters are consistent with Grade II diastolic dysfunction  (pseudonormalization).   2. Right ventricular systolic function is normal. The right ventricular  size is normal.   3. The mitral valve is normal in structure. Trivial mitral valve  regurgitation. No evidence of mitral stenosis.   4. The aortic valve is normal in structure. Aortic valve regurgitation is  not visualized. Mild aortic valve sclerosis is present, with no evidence  of aortic valve stenosis.   5. The inferior vena cava is normal in size with greater than 50%  respiratory variability, suggesting right atrial pressure of 3 mmHg.   FINDINGS   Left Ventricle: Left ventricular ejection fraction, by estimation, is 60  to 65%. The left ventricle has normal function. The left ventricle has no  regional wall motion abnormalities. The left ventricular internal cavity  size was normal in size. There is    severe asymmetric left ventricular hypertrophy. Left ventricular  diastolic parameters are consistent with Grade II diastolic dysfunction  (pseudonormalization).   Right Ventricle: The right ventricular size is normal. No increase in  right ventricular wall thickness. Right ventricular systolic function is  normal.   Left Atrium: Left atrial size was normal in size.   Right Atrium: Right atrial size was normal in size.   Pericardium: There is no evidence of pericardial effusion. Presence of  pericardial fat pad.   Mitral Valve: The mitral valve is normal in structure. Trivial mitral  valve regurgitation. No evidence of mitral valve stenosis.   Tricuspid Valve: The tricuspid valve is normal in structure. Tricuspid  valve regurgitation is not demonstrated. No evidence of tricuspid  stenosis.   Aortic Valve: The aortic valve is normal in structure. Aortic valve  regurgitation is not visualized. Mild aortic valve sclerosis is present,  with no evidence of aortic valve stenosis.   Pulmonic Valve: The pulmonic valve was normal in structure. Pulmonic valve  regurgitation is not visualized. No evidence of pulmonic stenosis.   Aorta: The aortic root is normal in size and structure.   Venous: The inferior vena cava is normal in size with greater than 50%  respiratory variability, suggesting right atrial pressure of 3 mmHg.   IAS/Shunts: No atrial level shunt detected by color flow Doppler.     Recent Labs: 03/06/2020: Hemoglobin 11.6; Platelets 136 03/14/2020: BUN 14; Creatinine, Ser 0.89; Magnesium 1.4; Potassium 3.9; Sodium 138  Recent Lipid Panel No results found for: CHOL, TRIG, HDL, CHOLHDL, VLDL, LDLCALC, LDLDIRECT  Physical Exam:    VS:  BP (!) 148/96   Pulse 74   Ht 5\' 5"  (1.651 m)   Wt 173 lb 3.2 oz (78.6 kg)   SpO2 97%   BMI 28.82 kg/m     Wt Readings from Last 3 Encounters:  01/21/21 173 lb 3.2 oz (78.6 kg)  04/11/20 181 lb (82.1 kg)  03/14/20 189 lb (85.7  kg)     GEN: Well nourished, well developed in no acute distress HEENT: Normal NECK: No JVD; No carotid bruits LYMPHATICS: No lymphadenopathy CARDIAC: S1S2 noted,RRR, no murmurs, rubs, gallops RESPIRATORY:  Clear to auscultation without rales, wheezing or rhonchi  ABDOMEN: Soft, non-tender, non-distended, +bowel sounds, no guarding. EXTREMITIES: No edema, No cyanosis, no clubbing MUSCULOSKELETAL:  No deformity  SKIN: Warm and dry NEUROLOGIC:  Alert and oriented x 3, non-focal PSYCHIATRIC:  Normal affect, good insight  ASSESSMENT:    1. Hypertension, unspecified  type   2. Mild CAD   3. SOB (shortness of breath)   4. Other chronic pulmonary embolism without acute cor pulmonale (HCC)   5. Essential hypertension   6. Mild coronary artery disease   7. Diastolic dysfunction   8. Obesity (BMI 30-39.9)    PLAN:     Her blood pressure is elevated in the office today.  Like to add Coreg 3.25 mg twice daily to her current medical regimen which includes amlodipine 10 mg daily as well as hydrochlorothiazide 25 mg.  With her recent chemo therapy we will repeat an echocardiogram to reassess LV function with strain.  Further recommendations may be made based on this.  No anginal symptoms continue patient on her regimen.  The patient is in agreement with the above plan. The patient left the office in stable condition.  The patient will follow up in 6 months.   Medication Adjustments/Labs and Tests Ordered: Current medicines are reviewed at length with the patient today.  Concerns regarding medicines are outlined above.  Orders Placed This Encounter  Procedures   EKG 12-Lead   ECHOCARDIOGRAM COMPLETE   Meds ordered this encounter  Medications   carvedilol (COREG) 3.125 MG tablet    Sig: Take 1 tablet (3.125 mg total) by mouth 2 (two) times daily.    Dispense:  180 tablet    Refill:  3    Patient Instructions  Medication Instructions:  Your physician has recommended you make the  following change in your medication:  START: Coreg (Carvedilol) 3.125 mg twice daily  *If you need a refill on your cardiac medications before your next appointment, please call your pharmacy*   Lab Work: None If you have labs (blood work) drawn today and your tests are completely normal, you will receive your results only by: [place] (if you have MyChart) OR A paper copy in the mail If you have any lab test that is abnormal or we need to change your treatment, we will call you to review the results.   Testing/Procedures: Your physician has requested that you have an echocardiogram. Echocardiography is a painless test that uses sound waves to create images of your heart. It provides your doctor with information about the size and shape of your heart and how well your heart's chambers and valves are working. This procedure takes approximately one hour. There are no restrictions for this procedure.    Follow-Up: At [facility], you and your health needs are our priority.  As part of our continuing mission to provide you with exceptional heart care, we have created designated Provider Care Teams.  These Care Teams include your primary Cardiologist (physician) and Advanced Practice Providers (APPs -  Physician Assistants and Nurse Practitioners) who all work together to provide you with the care you need, when you need it.  We recommend signing up for the patient portal called "MyChart".  Sign up information is provided on this After Visit Summary.  MyChart is used to connect with patients for Virtual Visits (Telemedicine).  Patients are able to view lab/test results, encounter notes, upcoming appointments, etc.  Non-urgent messages can be sent to your provider as well.   To learn more about what you can do with MyChart, go to [email or web address].    Your next appointment:   6 month(s)  The format for your next appointment:   In Person  Provider:   Berniece Salines, DO      Other Instructions  Echocardiogram An echocardiogram is a test that  uses sound waves (ultrasound) to produce images of the heart. Images from an echocardiogram can provide important information about: Heart size and shape. The size and thickness and movement of your heart's walls. Heart muscle function and strength. Heart valve function or if you have stenosis. Stenosis is when the heart valves are too narrow. If blood is flowing backward through the heart valves (regurgitation). A tumor or infectious growth around the heart valves. Areas of heart muscle that are not working well because of poor blood flow or injury from a heart attack. Aneurysm detection. An aneurysm is a weak or damaged part of an artery wall. The wall bulges out from the normal force of blood pumping through the body. Tell a health care provider about: Any allergies you have. All medicines you are taking, including vitamins, herbs, eye drops, creams, and over-the-counter medicines. Any blood disorders you have. Any surgeries you have had. Any medical conditions you have. Whether you are pregnant or may be pregnant. What are the risks? Generally, this is a safe test. However, problems may occur, including an allergic reaction to dye (contrast) that may be used during the test. What happens before the test? No specific preparation is needed. You may eat and drink normally. What happens during the test?  You will take off your clothes from the waist up and put on a hospital gown. Electrodes or electrocardiogram (ECG)patches may be placed on your chest. The electrodes or patches are then connected to a device that monitors your heart rate and rhythm. You will lie down on a table for an ultrasound exam. A gel will be applied to your chest to help sound waves pass through your skin. A handheld device, called a transducer, will be pressed against your chest and moved over your heart. The transducer produces sound waves  that travel to your heart and bounce back (or "echo" back) to the transducer. These sound waves will be captured in real-time and changed into images of your heart that can be viewed on a video monitor. The images will be recorded on a computer and reviewed by your health care provider. You may be asked to change positions or hold your breath for a short time. This makes it easier to get different views or better views of your heart. In some cases, you may receive contrast through an IV in one of your veins. This can improve the quality of the pictures from your heart. The procedure may vary among health care providers and hospitals. What can I expect after the test? You may return to your normal, everyday life, including diet, activities, and medicines, unless your health care provider tells you not to do that. Follow these instructions at home: It is up to you to get the results of your test. Ask your health care provider, or the department that is doing the test, when your results will be ready. Keep all follow-up visits. This is important. Summary An echocardiogram is a test that uses sound waves (ultrasound) to produce images of the heart. Images from an echocardiogram can provide important information about the size and shape of your heart, heart muscle function, heart valve function, and other possible heart problems. You do not need to do anything to prepare before this test. You may eat and drink normally. After the echocardiogram is completed, you may return to your normal, everyday life, unless your health care provider tells you not to do that. This information is not intended to replace advice given to you  by your health care provider. Make sure you discuss any questions you have with your health care provider. Document Revised: 10/23/2020 Document Reviewed: 10/03/2019 Elsevier Patient Education  2022 [place].   Adopting a Healthy Lifestyle.  Know what a healthy weight is for  you (roughly BMI <25) and aim to maintain this   Aim for 7+ servings of fruits and vegetables daily   65-80+ fluid ounces of water or unsweet tea for healthy kidneys   Limit to max 1 drink of alcohol per day; avoid smoking/tobacco   Limit animal fats in diet for cholesterol and heart health - choose grass fed whenever available   Avoid highly processed foods, and foods high in saturated/trans fats   Aim for low stress - take time to unwind and care for your mental health   Aim for 150 min of moderate intensity exercise weekly for heart health, and weights twice weekly for bone health   Aim for 7-9 hours of sleep daily   When it comes to diets, agreement about the perfect plan isnt easy to find, even among the experts. Experts at the [employer] developed an idea known as the Healthy Eating Plate. Just imagine a plate divided into logical, healthy portions.   The emphasis is on diet quality:   Load up on vegetables and fruits - one-half of your plate: Aim for color and variety, and remember that potatoes dont count.   Go for whole grains - one-quarter of your plate: Whole wheat, barley, wheat berries, quinoa, oats, brown rice, and foods made with them. If you want pasta, go with whole wheat pasta.   Protein power - one-quarter of your plate: Fish, chicken, beans, and nuts are all healthy, versatile protein sources. Limit red meat.   The diet, however, does go beyond the plate, offering a few other suggestions.   Use healthy plant oils, such as olive, canola, soy, corn, sunflower and peanut. Check the labels, and avoid partially hydrogenated oil, which have unhealthy trans fats.   If youre thirsty, drink water. Coffee and tea are good in moderation, but skip sugary drinks and limit milk and dairy products to one or two daily servings.   The type of carbohydrate in the diet is more important than the amount. Some sources of carbohydrates, such as vegetables,  fruits, whole grains, and beans-are healthier than others.   Finally, stay active  Signed, Berniece Salines, DO  01/22/2021 8:34 AM    [facility]

## 2021-02-19 ENCOUNTER — Other Ambulatory Visit (HOSPITAL_BASED_OUTPATIENT_CLINIC_OR_DEPARTMENT_OTHER): Payer: Medicare Other

## 2021-02-21 ENCOUNTER — Ambulatory Visit (HOSPITAL_BASED_OUTPATIENT_CLINIC_OR_DEPARTMENT_OTHER): Payer: Medicare Other

## 2021-03-17 ENCOUNTER — Ambulatory Visit (HOSPITAL_BASED_OUTPATIENT_CLINIC_OR_DEPARTMENT_OTHER)
Admission: RE | Admit: 2021-03-17 | Discharge: 2021-03-17 | Disposition: A | Discharge status: Home / Self Care | Payer: Medicare Other | Source: Ambulatory Visit | Attending: Cardiology | Admitting: Cardiology

## 2021-03-17 ENCOUNTER — Other Ambulatory Visit: Payer: Self-pay

## 2021-03-17 DIAGNOSIS — R0602 Shortness of breath: Secondary | ICD-10-CM | POA: Insufficient documentation

## 2021-03-17 NOTE — Progress Notes (Signed)
°  Echocardiogram 2D Echocardiogram has been performed.  Crystal Wolf 03/17/2021, 4:51 PM

## 2021-03-18 LAB — ECHOCARDIOGRAM COMPLETE
AR max vel: 1.72 cm2
AV Area VTI: 1.54 cm2
AV Area mean vel: 1.32 cm2
AV Mean grad: 5 mmHg
AV Peak grad: 8 mmHg
Ao pk vel: 1.41 m/s
Area-P 1/2: 2.96 cm2
S' Lateral: 3.2 cm

## 2022-01-15 ENCOUNTER — Other Ambulatory Visit: Payer: Self-pay | Admitting: Cardiology

## 2022-03-14 IMAGING — CT CT HEART MORP W/ CTA COR W/ SCORE W/ CA W/CM &/OR W/O CM
2 of 5 series · 12 of 20 positions shown, 14 images · non-contrast
Comparison: None.
COMPARISON: None.
COMPARISON: None.

Addendum:
EXAM:
OVER-READ INTERPRETATION  CT CHEST

The following report is an over-read performed by radiologist Dr.
over-read does not include interpretation of cardiac or coronary
anatomy or pathology. The coronary CTA interpretation by the
cardiologist is attached.
CLINICAL DATA: This is a 69 year old male with chest pain.
Cardiac/Coronary  CT
TECHNIQUE: The patient was scanned on a [employer] scanner.
CLINICAL DATA: 69 year old female with chest pain.

[Series 6: best diast 73 % · axial · 0.39mm/px · z∈[+1050,+1128]mm · 6 of 275 slices shown, 8 images]
[im 40/275  vessel]
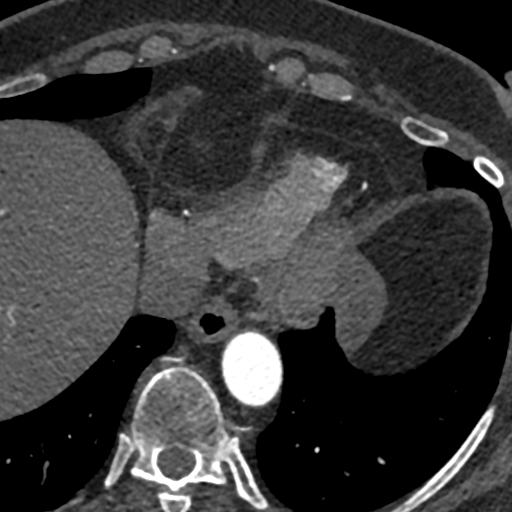
[im 40/275  lung]
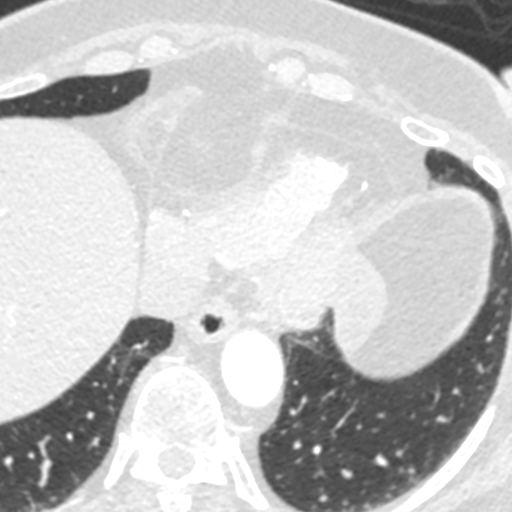
[im 79/275  vessel]
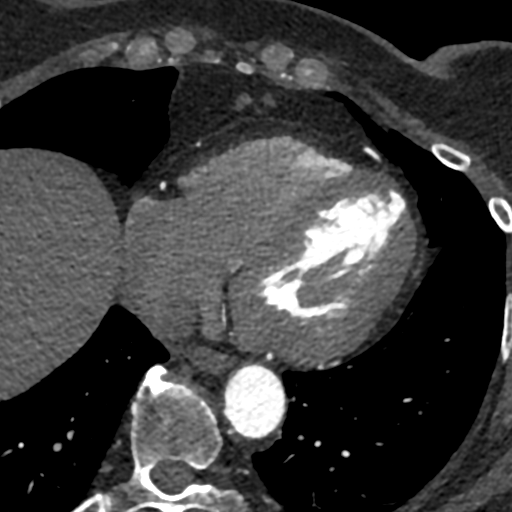
[im 118/275  vessel]
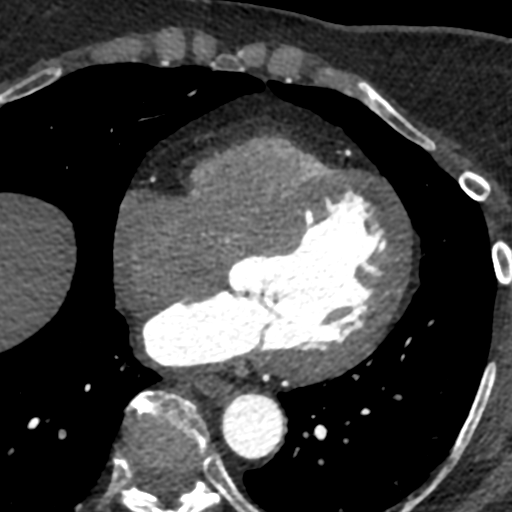
[im 157/275  vessel]
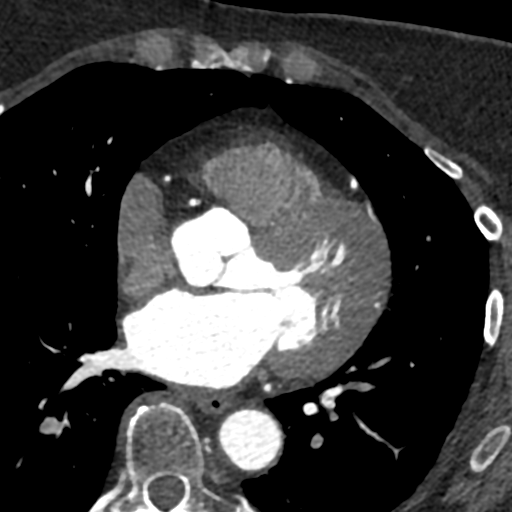
[im 196/275  vessel]
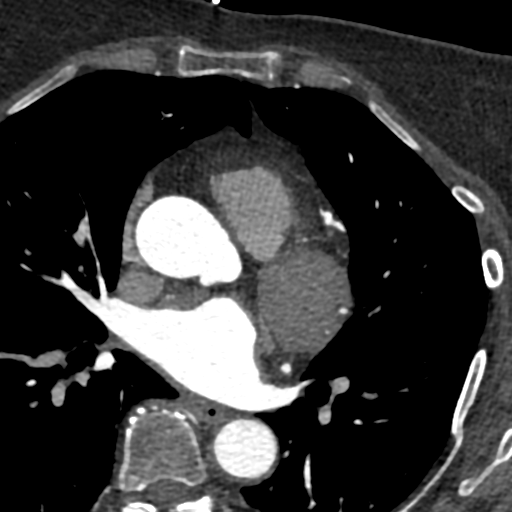
[im 196/275  lung]
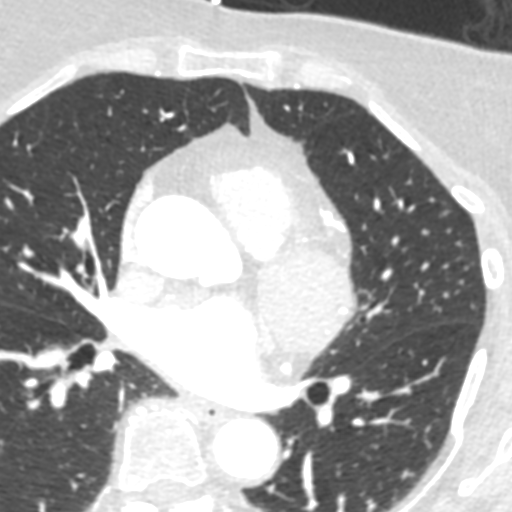
[im 235/275  vessel]
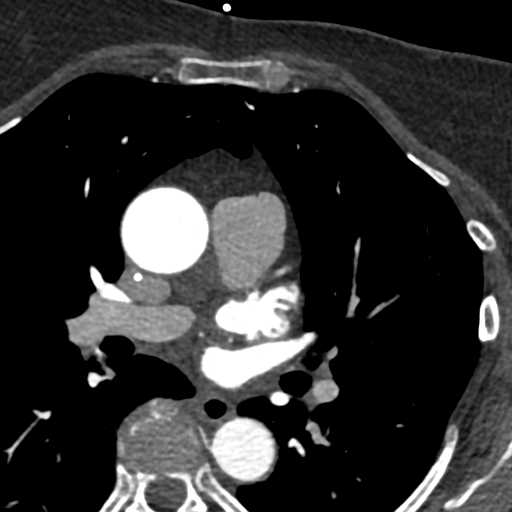

[Series 12: best syst 36 % · axial · 0.39mm/px · z∈[+1050,+1128]mm · 6 of 275 slices shown]
[im 40/275  vessel]
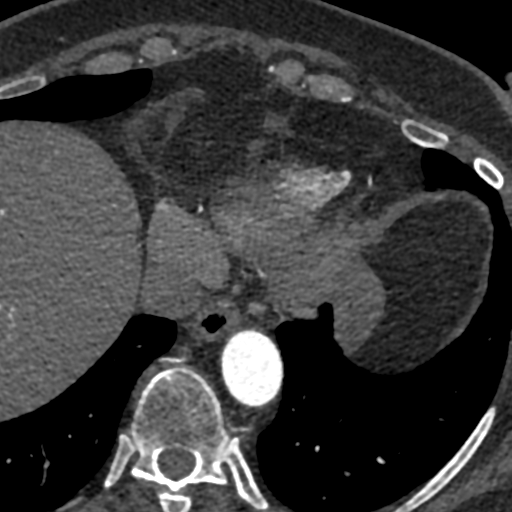
[im 79/275  vessel]
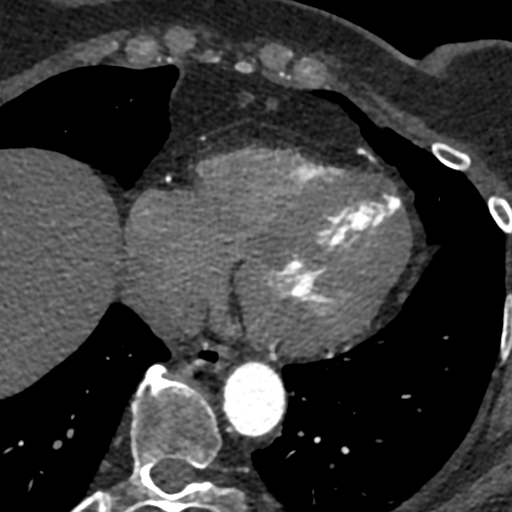
[im 118/275  vessel]
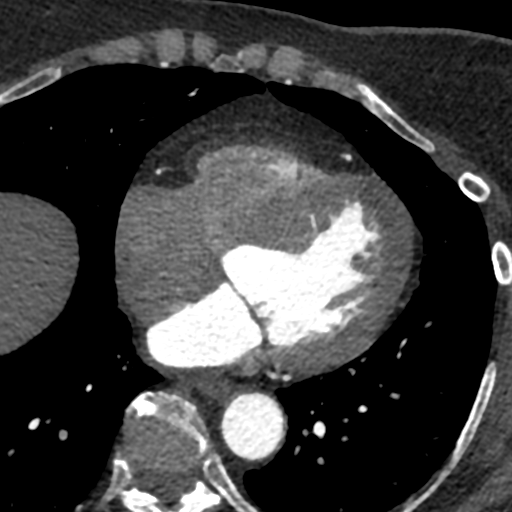
[im 157/275  vessel]
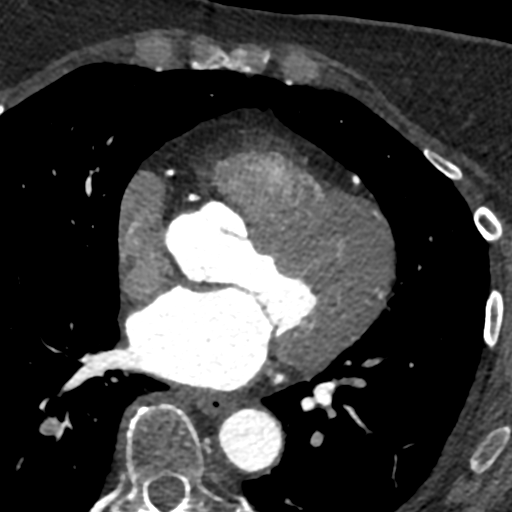
[im 196/275  vessel]
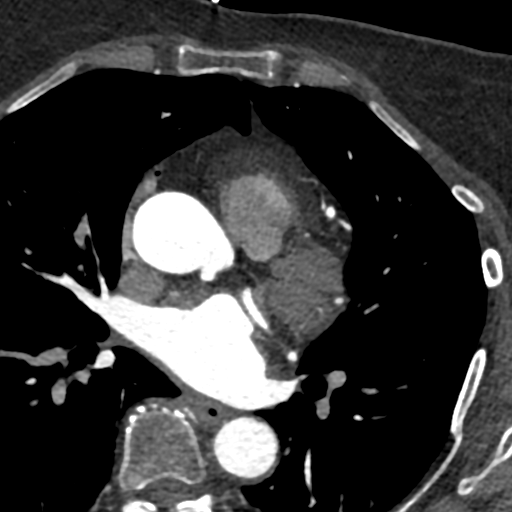
[im 235/275  vessel]
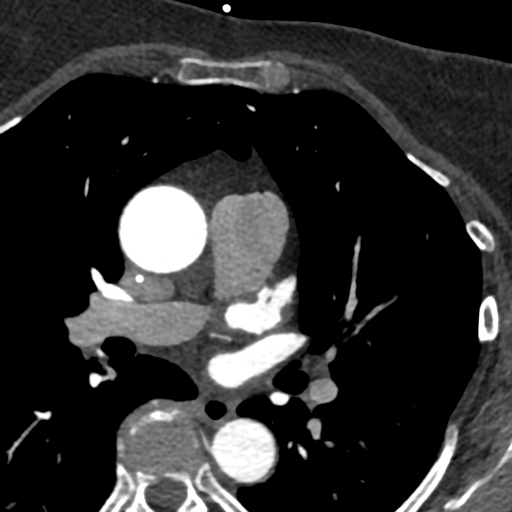

[12 of 20 positions shown; findings below may reference images not displayed]

FINDINGS: Vascular: No significant vascular findings. Normal heart size. No
pericardial effusion.

Mediastinum/Nodes: Visualized mediastinum and hilar regions
demonstrate no lymphadenopathy or masses.

Lungs/Pleura: Visualized lungs show no evidence of pulmonary edema,
consolidation, pneumothorax, nodule or pleural fluid.

Upper Abdomen: No acute abnormality.

Musculoskeletal: No chest wall mass or suspicious bone lesions
identified.
IMPRESSION: No significant incidental findings.
FINDINGS: A 120 kV retrospective scan was triggered in the descending thoracic
aorta at 111 HU's. Axial non-contrast 3 mm slices were carried out
through the heart. The data set was analyzed on a dedicated work
station and scored using the Agatson method. Gantry rotation speed
was 250 msecs and collimation was .6 mm. No beta blockade and 0.8 mg
of sl NTG was given. The 3D data set was reconstructed in 5%
intervals of the 67-82 % of the R-R cycle. Diastolic phases were
analyzed on a dedicated work station using MPR, MIP and VRT modes.
The patient received 80 cc of contrast.

Aorta: Normal size.  No calcifications.  No dissection.

Aortic Valve:  Trileaflet.  No calcifications.

Coronary calcium score 74

Coronary Arteries:  Normal coronary origin.  Left dominance.

RCA is a large dominant artery that gives rise to PDA and PLVB.
There is no plaque.

Left main is a large artery that gives rise to LAD, Ramus
Intermedius and LCX arteries.

LAD is a large vessel. There is a mild (25-49%) proximal calcified
plaques. There is mild LAD calcified plaque in the mid LAD. The
distal LAD with no plaques.

LCX is a dominant artery that gives rise to one large OM1 branch.
There is no plaque.

Other findings:

Normal pulmonary vein drainage into the left atrium.

Normal left atrial appendage without a thrombus.

Normal size of the pulmonary artery.
IMPRESSION: 1. Coronary calcium score of 74. This was 0 percentile for age and
sex matched control.

2. Normal coronary origin with Left dominance.

3. Mild CAD. CADRADs 2. Aggressive medical management is
recommended.

Siddhartha Nijjar, DO
FINDINGS: A 120 kV retrospective scan was triggered in the descending thoracic
aorta at 111 HU's. Axial non-contrast 3 mm slices were carried out
through the heart. The data set was analyzed on a dedicated work
station and scored using the Agatson method. Gantry rotation speed
was 250 msecs and collimation was .6 mm. No beta blockade and 0.8 mg
of sl NTG was given. The 3D data set was reconstructed in 5%
intervals of the 67-82 % of the R-R cycle. Diastolic phases were
analyzed on a dedicated work station using MPR, MIP and VRT modes.
The patient received 80 cc of contrast.

Aorta: Normal size.  No calcifications.  No dissection.

Aortic Valve:  Trileaflet.  No calcifications.

Coronary calcium score 74

Coronary Arteries:  Normal coronary origin.  Left dominance.

RCA is a large dominant artery that gives rise to PDA and PLVB.
There is no plaque.

Left main is a large artery that gives rise to LAD, Ramus
Intermedius and LCX arteries.

LAD is a large vessel. There is a mild (25-49%) proximal calcified
plaques. There is mild LAD calcified plaque in the mid LAD. The

distal LAD with no plaques.

LCX is a dominant artery that gives rise to one large OM1 branch.
There is no plaque.

Other findings:

Normal pulmonary vein drainage into the left atrium.

Normal left atrial appendage without a thrombus.

Normal size of the pulmonary artery.
IMPRESSION: 1. Coronary calcium score of 74. This was 72 percentile for age and
sex matched control.

2. Normal coronary origin with Left dominance.

3. Mild CAD. CADRADs 2. Aggressive medical management is
recommended.

Siddhartha Nijjar, DO

*** End of Addendum ***
Addendum:
EXAM:
OVER-READ INTERPRETATION  CT CHEST

The following report is an over-read performed by radiologist Dr.
over-read does not include interpretation of cardiac or coronary
anatomy or pathology. The coronary CTA interpretation by the
cardiologist is attached.
FINDINGS: Vascular: No significant vascular findings. Normal heart size. No
pericardial effusion.

Mediastinum/Nodes: Visualized mediastinum and hilar regions
demonstrate no lymphadenopathy or masses.

Lungs/Pleura: Visualized lungs show no evidence of pulmonary edema,
consolidation, pneumothorax, nodule or pleural fluid.

Upper Abdomen: No acute abnormality.

Musculoskeletal: No chest wall mass or suspicious bone lesions
identified.
IMPRESSION: No significant incidental findings.
FINDINGS: A 120 kV retrospective scan was triggered in the descending thoracic
aorta at 111 HU's. Axial non-contrast 3 mm slices were carried out
through the heart. The data set was analyzed on a dedicated work
station and scored using the Agatson method. Gantry rotation speed
was 250 msecs and collimation was .6 mm. No beta blockade and 0.8 mg
of sl NTG was given. The 3D data set was reconstructed in 5%
intervals of the 67-82 % of the R-R cycle. Diastolic phases were
analyzed on a dedicated work station using MPR, MIP and VRT modes.
The patient received 80 cc of contrast.

Aorta: Normal size.  No calcifications.  No dissection.

Aortic Valve:  Trileaflet.  No calcifications.

Coronary calcium score 74

Coronary Arteries:  Normal coronary origin.  Left dominance.

RCA is a large dominant artery that gives rise to PDA and PLVB.
There is no plaque.

Left main is a large artery that gives rise to LAD, Ramus
Intermedius and LCX arteries.

LAD is a large vessel. There is a mild (25-49%) proximal calcified
plaques. There is mild LAD calcified plaque in the mid LAD. The
distal LAD with no plaques.

LCX is a dominant artery that gives rise to one large OM1 branch.
There is no plaque.

Other findings:

Normal pulmonary vein drainage into the left atrium.

Normal left atrial appendage without a thrombus.

Normal size of the pulmonary artery.
IMPRESSION: 1. Coronary calcium score of 74. This was 0 percentile for age and
sex matched control.

2. Normal coronary origin with Left dominance.

3. Mild CAD. CADRADs 2. Aggressive medical management is
recommended.

Siddhartha Nijjar, DO

*** End of Addendum ***
EXAM:
OVER-READ INTERPRETATION  CT CHEST

The following report is an over-read performed by radiologist Dr.
over-read does not include interpretation of cardiac or coronary
anatomy or pathology. The coronary CTA interpretation by the
cardiologist is attached.
FINDINGS: Vascular: No significant vascular findings. Normal heart size. No
pericardial effusion.

Mediastinum/Nodes: Visualized mediastinum and hilar regions
demonstrate no lymphadenopathy or masses.

Lungs/Pleura: Visualized lungs show no evidence of pulmonary edema,
consolidation, pneumothorax, nodule or pleural fluid.

Upper Abdomen: No acute abnormality.

Musculoskeletal: No chest wall mass or suspicious bone lesions
identified.
IMPRESSION: No significant incidental findings.

## 2022-04-24 DEATH — deceased
